# Patient Record
Sex: Male | Born: 1940 | Race: White | Hispanic: No | Marital: Married | Smoking: Former smoker
Health system: Southern US, Community
[De-identification: ages and names within clinical notes are randomized; demographics above are authoritative.]

## PROBLEM LIST (undated history)

## (undated) DIAGNOSIS — Q858 Other phakomatoses, not elsewhere classified: Secondary | ICD-10-CM

## (undated) DIAGNOSIS — I739 Peripheral vascular disease, unspecified: Secondary | ICD-10-CM

## (undated) DIAGNOSIS — M4800 Spinal stenosis, site unspecified: Secondary | ICD-10-CM

## (undated) DIAGNOSIS — M109 Gout, unspecified: Secondary | ICD-10-CM

## (undated) DIAGNOSIS — T753XXA Motion sickness, initial encounter: Secondary | ICD-10-CM

## (undated) DIAGNOSIS — E785 Hyperlipidemia, unspecified: Secondary | ICD-10-CM

## (undated) DIAGNOSIS — L570 Actinic keratosis: Secondary | ICD-10-CM

## (undated) DIAGNOSIS — Q8589 Other phakomatoses, not elsewhere classified: Secondary | ICD-10-CM

## (undated) DIAGNOSIS — B029 Zoster without complications: Secondary | ICD-10-CM

## (undated) DIAGNOSIS — M1990 Unspecified osteoarthritis, unspecified site: Secondary | ICD-10-CM

## (undated) DIAGNOSIS — F419 Anxiety disorder, unspecified: Secondary | ICD-10-CM

## (undated) DIAGNOSIS — R7303 Prediabetes: Secondary | ICD-10-CM

## (undated) DIAGNOSIS — E119 Type 2 diabetes mellitus without complications: Secondary | ICD-10-CM

## (undated) DIAGNOSIS — H353221 Exudative age-related macular degeneration, left eye, with active choroidal neovascularization: Secondary | ICD-10-CM

## (undated) DIAGNOSIS — Z974 Presence of external hearing-aid: Secondary | ICD-10-CM

## (undated) DIAGNOSIS — F410 Panic disorder [episodic paroxysmal anxiety] without agoraphobia: Secondary | ICD-10-CM

## (undated) HISTORY — PX: EYE SURGERY: SHX253

## (undated) HISTORY — PX: OTHER SURGICAL HISTORY: SHX169

## (undated) HISTORY — DX: Actinic keratosis: L57.0

## (undated) HISTORY — DX: Prediabetes: R73.03

## (undated) HISTORY — PX: TONSILLECTOMY: SUR1361

## (undated) HISTORY — PX: BILATERAL HIP ARTHROSCOPY: SUR89

## (undated) HISTORY — PX: JOINT REPLACEMENT: SHX530

---

## 2004-05-17 ENCOUNTER — Emergency Department: Payer: Self-pay | Admitting: Emergency Medicine

## 2004-11-20 ENCOUNTER — Ambulatory Visit: Payer: Self-pay | Admitting: Internal Medicine

## 2006-08-04 ENCOUNTER — Ambulatory Visit: Payer: Self-pay | Admitting: Unknown Physician Specialty

## 2006-11-13 ENCOUNTER — Ambulatory Visit: Payer: Self-pay | Admitting: Unknown Physician Specialty

## 2008-10-15 ENCOUNTER — Ambulatory Visit: Payer: Self-pay | Admitting: Family Medicine

## 2008-10-16 ENCOUNTER — Ambulatory Visit: Payer: Self-pay | Admitting: Internal Medicine

## 2008-10-23 ENCOUNTER — Ambulatory Visit: Payer: Self-pay | Admitting: Family Medicine

## 2009-11-23 ENCOUNTER — Ambulatory Visit: Payer: Self-pay | Admitting: Pain Medicine

## 2009-12-07 ENCOUNTER — Ambulatory Visit: Payer: Self-pay | Admitting: Pain Medicine

## 2009-12-19 ENCOUNTER — Ambulatory Visit: Payer: Self-pay | Admitting: Pain Medicine

## 2010-01-10 ENCOUNTER — Ambulatory Visit: Payer: Self-pay | Admitting: Pain Medicine

## 2010-03-01 ENCOUNTER — Ambulatory Visit: Payer: Self-pay | Admitting: Unknown Physician Specialty

## 2011-07-08 ENCOUNTER — Ambulatory Visit: Payer: Self-pay

## 2011-08-19 ENCOUNTER — Ambulatory Visit: Payer: Self-pay | Admitting: Internal Medicine

## 2015-02-27 ENCOUNTER — Ambulatory Visit: Admit: 2015-02-27 | Payer: Self-pay | Admitting: Gastroenterology

## 2015-02-27 SURGERY — COLONOSCOPY
Anesthesia: General

## 2015-03-03 DIAGNOSIS — M1A00X0 Idiopathic chronic gout, unspecified site, without tophus (tophi): Secondary | ICD-10-CM | POA: Insufficient documentation

## 2015-04-06 ENCOUNTER — Encounter: Payer: Self-pay | Admitting: *Deleted

## 2015-04-07 ENCOUNTER — Encounter: Payer: Self-pay | Admitting: *Deleted

## 2015-04-07 ENCOUNTER — Encounter
Admission: RE | Disposition: A | Discharge status: Home / Self Care | Payer: Self-pay | Source: Ambulatory Visit | Attending: Unknown Physician Specialty

## 2015-04-07 ENCOUNTER — Ambulatory Visit: Payer: Medicare Other | Admitting: Anesthesiology

## 2015-04-07 ENCOUNTER — Ambulatory Visit
Admission: RE | Admit: 2015-04-07 | Discharge: 2015-04-07 | Disposition: A | Discharge status: Home / Self Care | Payer: Medicare Other | Source: Ambulatory Visit | Attending: Unknown Physician Specialty | Admitting: Unknown Physician Specialty

## 2015-04-07 DIAGNOSIS — K640 First degree hemorrhoids: Secondary | ICD-10-CM | POA: Diagnosis not present

## 2015-04-07 DIAGNOSIS — M4800 Spinal stenosis, site unspecified: Secondary | ICD-10-CM | POA: Diagnosis not present

## 2015-04-07 DIAGNOSIS — Z801 Family history of malignant neoplasm of trachea, bronchus and lung: Secondary | ICD-10-CM | POA: Insufficient documentation

## 2015-04-07 DIAGNOSIS — M1990 Unspecified osteoarthritis, unspecified site: Secondary | ICD-10-CM | POA: Diagnosis not present

## 2015-04-07 DIAGNOSIS — E785 Hyperlipidemia, unspecified: Secondary | ICD-10-CM | POA: Diagnosis not present

## 2015-04-07 DIAGNOSIS — F419 Anxiety disorder, unspecified: Secondary | ICD-10-CM | POA: Diagnosis not present

## 2015-04-07 DIAGNOSIS — Z86010 Personal history of colonic polyps: Secondary | ICD-10-CM | POA: Diagnosis present

## 2015-04-07 DIAGNOSIS — Z8041 Family history of malignant neoplasm of ovary: Secondary | ICD-10-CM | POA: Diagnosis not present

## 2015-04-07 DIAGNOSIS — Z87891 Personal history of nicotine dependence: Secondary | ICD-10-CM | POA: Diagnosis not present

## 2015-04-07 DIAGNOSIS — E119 Type 2 diabetes mellitus without complications: Secondary | ICD-10-CM | POA: Insufficient documentation

## 2015-04-07 DIAGNOSIS — Z79899 Other long term (current) drug therapy: Secondary | ICD-10-CM | POA: Diagnosis not present

## 2015-04-07 DIAGNOSIS — Z800 Family history of malignant neoplasm of digestive organs: Secondary | ICD-10-CM | POA: Insufficient documentation

## 2015-04-07 DIAGNOSIS — M109 Gout, unspecified: Secondary | ICD-10-CM | POA: Insufficient documentation

## 2015-04-07 DIAGNOSIS — Q858 Other phakomatoses, not elsewhere classified: Secondary | ICD-10-CM | POA: Insufficient documentation

## 2015-04-07 DIAGNOSIS — Z9660 Presence of unspecified orthopedic joint implant: Secondary | ICD-10-CM | POA: Diagnosis not present

## 2015-04-07 HISTORY — DX: Hyperlipidemia, unspecified: E78.5

## 2015-04-07 HISTORY — PX: ESOPHAGOGASTRODUODENOSCOPY (EGD) WITH PROPOFOL: SHX5813

## 2015-04-07 HISTORY — DX: Other phakomatoses, not elsewhere classified: Q85.89

## 2015-04-07 HISTORY — DX: Gout, unspecified: M10.9

## 2015-04-07 HISTORY — DX: Zoster without complications: B02.9

## 2015-04-07 HISTORY — DX: Other phakomatoses, not elsewhere classified: Q85.8

## 2015-04-07 HISTORY — DX: Spinal stenosis, site unspecified: M48.00

## 2015-04-07 HISTORY — DX: Unspecified osteoarthritis, unspecified site: M19.90

## 2015-04-07 HISTORY — DX: Anxiety disorder, unspecified: F41.9

## 2015-04-07 HISTORY — PX: COLONOSCOPY WITH PROPOFOL: SHX5780

## 2015-04-07 HISTORY — DX: Type 2 diabetes mellitus without complications: E11.9

## 2015-04-07 SURGERY — COLONOSCOPY WITH PROPOFOL
Anesthesia: General

## 2015-04-07 MED ORDER — SODIUM CHLORIDE 0.9 % IV SOLN
INTRAVENOUS | Status: DC
  Administered 2015-04-07: 09:00:00 via INTRAVENOUS

## 2015-04-07 MED ORDER — SODIUM CHLORIDE 0.9 % IV SOLN
INTRAVENOUS | Status: DC
  Administered 2015-04-07: 1000 mL via INTRAVENOUS

## 2015-04-07 MED ORDER — LIDOCAINE HCL (PF) 2 % IJ SOLN
INTRAMUSCULAR | Status: DC | PRN
  Administered 2015-04-07: 50 mg

## 2015-04-07 MED ORDER — FENTANYL CITRATE (PF) 100 MCG/2ML IJ SOLN
INTRAMUSCULAR | Status: DC | PRN
  Administered 2015-04-07: 50 ug via INTRAVENOUS

## 2015-04-07 MED ORDER — PHENYLEPHRINE HCL 10 MG/ML IJ SOLN
INTRAMUSCULAR | Status: DC | PRN
  Administered 2015-04-07: 100 ug via INTRAVENOUS

## 2015-04-07 MED ORDER — PROPOFOL 500 MG/50ML IV EMUL
INTRAVENOUS | Status: DC | PRN
  Administered 2015-04-07: 100 ug/kg/min via INTRAVENOUS

## 2015-04-07 MED ORDER — PROPOFOL 10 MG/ML IV BOLUS
INTRAVENOUS | Status: DC | PRN
  Administered 2015-04-07: 30 mg via INTRAVENOUS

## 2015-04-07 MED ORDER — PIPERACILLIN-TAZOBACTAM 3.375 G IVPB 30 MIN
3.3750 g | Freq: Once | INTRAVENOUS | Status: AC
  Administered 2015-04-07: 3.375 g via INTRAVENOUS
  Filled 2015-04-07: qty 50

## 2015-04-07 NOTE — Transfer of Care (Signed)
Immediate Anesthesia Transfer of Care Note  Patient: Joe Tyler  Procedure(s) Performed: Procedure(s): COLONOSCOPY WITH PROPOFOL (N/A) ESOPHAGOGASTRODUODENOSCOPY (EGD) WITH PROPOFOL (N/A)  Patient Location: PACU  Anesthesia Type:General  Level of Consciousness: sedated  Airway & Oxygen Therapy: Patient Spontanous Breathing and Patient connected to nasal cannula oxygen  Post-op Assessment: Report given to RN and Post -op Vital signs reviewed and stable  Post vital signs: Reviewed and stable  Last Vitals:  Filed Vitals:   04/07/15 0854  BP: 125/73  Pulse: 88  Temp: 36.7 C  Resp: 20    Complications: No apparent anesthesia complications

## 2015-04-07 NOTE — Op Note (Signed)
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 Ola Spurr, MD (Referring MD) Complications:     No immediate complications. Procedure:         Pre-Anesthesia Assessment:                    - After reviewing the risks and benefits, the patient was                     deemed in satisfactory condition to undergo the procedure.                    After obtaining informed consent, the colonoscope was                     passed under direct vision. Throughout the procedure, the                     patient's blood pressure, pulse, and oxygen saturations                     were monitored continuously. The Colonoscope was                     introduced through the anus and advanced to the the cecum,                     identified by appendiceal orifice and ileocecal valve. The                     colonoscopy was performed without difficulty. The patient                     tolerated the procedure well. The quality of the bowel                     preparation was good. Findings:      Internal hemorrhoids were found during endoscopy. The hemorrhoids were       small and Grade I (internal hemorrhoids that do not prolapse).      The exam was otherwise without abnormality. Impression:        - Internal hemorrhoids.                    - The examination was otherwise normal.                    - No specimens collected. Recommendation:    - Repeat colonoscopy in 5 years for surveillance. If in                     good health. Manya Silvas, MD 04/07/2015 9:51:31 AM This report has been signed electronically. Number of Addenda: 0 Note Initiated On: 04/07/2015 9:17 AM Scope Withdrawal Time: 0  hours 10 minutes 32 seconds  Total Procedure Duration: 0 hours 13 minutes 31 seconds       [facility]

## 2015-04-07 NOTE — H&P (Signed)
   Primary Care Physician:  Adrian Prows, MD Primary Gastroenterologist:  Dr. Vira Agar  Pre-Procedure History & Physical: HPI:  Joe Tyler is a 74 y.o. male is here for an endoscopy and colonoscopy.   Past Medical History  Diagnosis Date  . Diabetes mellitus without complication ([place])   . Hyperlipidemia   . Gout   . Anxiety     Panic Attack  . 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 Arthritis   . Shingles   . Spinal stenosis     Past Surgical History  Procedure Laterality Date  . Tonsillectomy    . Bilateral hip arthroscopy    . Joint replacement    . Benign tumor removed Right arm    Prior to Admission medications   Medication Sig Start Date End Date Taking? Authorizing Provider  allopurinol (ZYLOPRIM) 300 MG tablet Take 300 mg by mouth daily.   Yes Historical Provider, MD  ALPRAZolam (XANAX) 0.25 MG tablet Take 0.25 mg by mouth 3 (three) times daily as needed for anxiety.   Yes Historical Provider, MD  Multiple Vitamin (MULTIVITAMIN) tablet Take 1 tablet by mouth daily.   Yes Historical Provider, MD    Allergies as of 02/21/2015  . (Not on File)    Family History  Problem Relation Age of Onset  . Pancreatic cancer Mother   . Lung cancer Paternal Uncle   . Ovarian cancer Maternal Grandmother     Social History   Social History  . Marital Status: Married    Spouse Name: N/A  . Number of Children: N/A  . Years of Education: N/A   Occupational History  . Not on file.   Social History Main Topics  . Smoking status: Former Research scientist (life sciences)  . Smokeless tobacco: Never Used  . Alcohol Use: Yes  . Drug Use: No  . Sexual Activity: Not on file   Other Topics Concern  . Not on file   Social History Narrative    Review of Systems: See HPI, otherwise negative ROS  Physical Exam: BP 125/73 mmHg  Pulse 88  Temp(Src) 98 F (36.7 C) (Oral)  Resp 20  Ht 5\' 11"  (1.803 m)  Wt 92.08 kg (203 lb)  BMI 28.33 kg/m2  SpO2 96% General:   Alert,  pleasant and  cooperative in NAD Head:  Normocephalic and atraumatic. Neck:  Supple; no masses or thyromegaly. Lungs:  Clear throughout to auscultation.    Heart:  Regular rate and rhythm. Abdomen:  Soft, nontender and nondistended. Normal bowel sounds, without guarding, and without rebound.   Neurologic:  Alert and  oriented x4;  grossly normal neurologically.  Impression/Plan: [name] is here for an endoscopy and colonoscopy to be performed for [facility] colon polyps and possible Peutz Jeghers syndrom  Risks, benefits, limitations, and alternatives regarding  endoscopy and colonoscopy have been reviewed with the patient.  Questions have been answered.  All parties agreeable.   Gaylyn Cheers, MD  04/07/2015, 9:12 AM

## 2015-04-07 NOTE — Op Note (Signed)
[facility] Patient Name: Joe Tyler Procedure Date: 04/07/2015 9:18 AM MRN: [id number] Account #: [id number] Date of Birth: Mar 21, 1941 Admit Type: Outpatient Age: 74 Room: [facility] ENDO ROOM 1 Gender: Male Note Status: Finalized Procedure:         Upper GI endoscopy Indications:       Possible Peutz Jegers syndrome Providers:         Manya Silvas, MD Referring MD:      Youlanda Roys. Ola Spurr, MD (Referring MD) Medicines:         Propofol per Anesthesia Complications:     No immediate complications. Procedure:         Pre-Anesthesia Assessment:                    - After reviewing the risks and benefits, the patient was                     deemed in satisfactory condition to undergo the procedure.                    After obtaining informed consent, the endoscope was passed                     under direct vision. Throughout the procedure, the                     patient's blood pressure, pulse, and oxygen saturations                     were monitored continuously. The Endoscope was introduced                     through the mouth, and advanced to the second part of                     duodenum. The upper GI endoscopy was accomplished without                     difficulty. The patient tolerated the procedure well. Findings:      The examined esophagus was normal. GEJ 42cm.      The stomach was normal.      The examined duodenum was normal. There was some flatttening of folds Impression:        - Normal esophagus.                    - Normal stomach.                    - Normal examined duodenum.                    - No specimens collected. Recommendation:    - Perform a colonoscopy today. Check celiac panel and H.                     pylori blood test. Manya Silvas, MD 04/07/2015 9:32:11 AM This report has been signed electronically. Number of Addenda: 0 Note Initiated On: 04/07/2015 9:18 AM      [facility]

## 2015-04-07 NOTE — Anesthesia Preprocedure Evaluation (Signed)
Anesthesia Evaluation  Patient identified by MRN, date of birth, ID band Patient awake    Reviewed: Allergy & Precautions, H&P , NPO status , Patient's Chart, lab work & pertinent test results, reviewed documented beta blocker date and time   History of Anesthesia Complications (+) AWARENESS UNDER ANESTHESIA and history of anesthetic complications  Airway Mallampati: II  TM Distance: >3 FB Neck ROM: full    Dental no notable dental hx. 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:   Pulmonary neg shortness of breath, neg sleep apnea, neg COPD, neg recent URI, former smoker,    Pulmonary exam normal breath sounds clear to auscultation       Cardiovascular Exercise Tolerance: Good negative cardio ROS Normal cardiovascular exam Rhythm:regular Rate:Normal     Neuro/Psych negative neurological ROS  negative psych ROS   GI/Hepatic Neg liver ROS, GERD  ,  Endo/Other  diabetes (borderline, controlled with diet)  Renal/GU negative Renal ROS  negative genitourinary   Musculoskeletal   Abdominal   Peds  Hematology negative hematology ROS (+)   Anesthesia Other Findings Past Medical History:   Diabetes mellitus without complication (HCC)                 Hyperlipidemia                                               Gout                                                         Anxiety                                                        Comment:Panic Attack   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[place])                                 Arthritis                                                    Shingles                                                     Spinal stenosis                                              Reproductive/Obstetrics negative OB ROS                             Anesthesia Physical Anesthesia  Plan  ASA: II  Anesthesia Plan: General   Post-op Pain Management:    Induction:   Airway  Management Planned:   Additional Equipment:   Intra-op Plan:   Post-operative Plan:   Informed Consent: I have reviewed the patients History and Physical, chart, labs and discussed the procedure including the risks, benefits and alternatives for the proposed anesthesia with the patient or authorized representative who has indicated his/her understanding and acceptance.   Dental Advisory Given  Plan Discussed with: Anesthesiologist, CRNA and Surgeon  Anesthesia Plan Comments:         Anesthesia Quick Evaluation

## 2015-04-10 NOTE — Anesthesia Postprocedure Evaluation (Addendum)
  Anesthesia Post-op Note  Patient: Joe Tyler  Procedure(s) Performed: Procedure(s): COLONOSCOPY WITH PROPOFOL (N/A) ESOPHAGOGASTRODUODENOSCOPY (EGD) WITH PROPOFOL (N/A)  Anesthesia type:General  Patient location: PACU  Post pain: Pain level controlled  Post assessment: Post-op Vital signs reviewed, Patient's Cardiovascular Status Stable, Respiratory Function Stable, Patent Airway and No signs of Nausea or vomiting  Post vital signs: Reviewed and stable  Last Vitals:  Filed Vitals:   04/07/15 1020  BP: 140/71  Pulse: 78  Temp:   Resp: 23    Level of consciousness: awake, alert  and patient cooperative  Complications: No apparent anesthesia complications

## 2015-04-13 ENCOUNTER — Encounter: Payer: Self-pay | Admitting: Unknown Physician Specialty

## 2015-05-04 ENCOUNTER — Encounter: Payer: Self-pay | Admitting: Oncology

## 2015-05-04 ENCOUNTER — Inpatient Hospital Stay: Payer: Medicare Other | Attending: Oncology | Admitting: Oncology

## 2015-05-04 DIAGNOSIS — Q858 Other phakomatoses, not elsewhere classified: Secondary | ICD-10-CM | POA: Insufficient documentation

## 2015-05-04 DIAGNOSIS — M1990 Unspecified osteoarthritis, unspecified site: Secondary | ICD-10-CM | POA: Insufficient documentation

## 2015-05-04 DIAGNOSIS — M4800 Spinal stenosis, site unspecified: Secondary | ICD-10-CM | POA: Insufficient documentation

## 2015-05-04 DIAGNOSIS — Q8589 Other phakomatoses, not elsewhere classified: Secondary | ICD-10-CM | POA: Insufficient documentation

## 2015-05-04 DIAGNOSIS — E785 Hyperlipidemia, unspecified: Secondary | ICD-10-CM | POA: Insufficient documentation

## 2015-05-16 ENCOUNTER — Telehealth: Payer: Self-pay

## 2015-05-16 NOTE — Telephone Encounter (Signed)
Received a fax from [facility] that genetic testing has been cancelled due to lack of insurance coverage and they have not received permission from patient to proceed with testing at their expense.

## 2015-05-20 NOTE — Progress Notes (Signed)
Joe Tyler  [phone]) [phone] [phone]) [phone]  ID: [name] OB: 18-Feb-1941  MR#: [id number]  [id number]  Patient Care Team: Adrian Prows, MD as PCP - General (Infectious Diseases)  CHIEF COMPLAINT:  Chief Complaint  Patient presents with  . New Patient (Initial Visit)    Genetic counseling    INTERVAL HISTORY: Patient is a 74 year old male who had a personal history of an isolated polyp consistent with Peutz-Jeghers syndrome.  He is referred to clinic for genetic counseling and possible testing. She currently feels well and is asymptomatic. He has no neurologic complaints. He denies any fevers. He has a good appetite and denies weight loss. He has no chest pain or shortness of breath. He denies any nausea, vomiting, constipation, or diarrhea. He has no melena or hematochezia. Patient feels at his baseline and offers no specific complaints today.  REVIEW OF SYSTEMS:   Review of Systems  Constitutional: Negative.   Respiratory: Negative.   Cardiovascular: Negative.   Gastrointestinal: Negative.  Negative for blood in stool and melena.  Musculoskeletal: Negative.   Neurological: Negative.     As per HPI. Otherwise, a complete review of systems is negatve.  PAST MEDICAL HISTORY: Past Medical History  Diagnosis Date  . Diabetes mellitus without complication ([place])   . Hyperlipidemia   . Gout   . Anxiety     Panic Attack  . 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 Arthritis   . Shingles   . Spinal stenosis     PAST SURGICAL HISTORY: Past Surgical History  Procedure Laterality Date  . Tonsillectomy    . Bilateral hip arthroscopy    . Joint replacement    . Benign tumor removed Right arm  . Colonoscopy with propofol N/A 04/07/2015    Procedure: COLONOSCOPY WITH PROPOFOL;  Surgeon: Manya Silvas, MD;  Location: [facility] ENDOSCOPY;  Service: Endoscopy;  Laterality: N/A;  . Esophagogastroduodenoscopy (egd) with propofol N/A 04/07/2015   Procedure: ESOPHAGOGASTRODUODENOSCOPY (EGD) WITH PROPOFOL;  Surgeon: Manya Silvas, MD;  Location: [facility] ENDOSCOPY;  Service: Endoscopy;  Laterality: N/A;    FAMILY HISTORY Family History  Problem Relation Age of Onset  . Pancreatic cancer Mother   . Lung cancer Paternal Uncle   . Ovarian cancer Maternal Grandmother        ADVANCED DIRECTIVES:    HEALTH MAINTENANCE: Social History  Substance Use Topics  . Smoking status: Former Research scientist (life sciences)  . Smokeless tobacco: Never Used  . Alcohol Use: Yes     Colonoscopy:  PAP:  Bone density:  Lipid panel:  Allergies  Allergen Reactions  . Levaquin [Levofloxacin] Other (See Comments) and Anxiety    Possible panic disorder Panic Disorder    Current Outpatient Prescriptions  Medication Sig Dispense Refill  . allopurinol (ZYLOPRIM) 300 MG tablet Take 300 mg by mouth daily.    Marland Kitchen ALPRAZolam (XANAX) 0.25 MG tablet Take 0.25 mg by mouth 3 (three) times daily as needed for anxiety.    . Multiple Vitamin (MULTIVITAMIN) tablet Take 1 tablet by mouth daily.     No current facility-administered medications for this visit.    OBJECTIVE: Filed Vitals:   05/04/15 1510  BP: 130/78  Pulse: 90  Temp: 97.4 F (36.3 C)     Body mass index is 28.09 kg/(m^2).    ECOG FS:0 - Asymptomatic  General: Well-developed, well-nourished, no acute distress. Eyes: Pink conjunctiva, anicteric sclera. Lungs: Clear to auscultation bilaterally. Heart: Regular rate and rhythm. No rubs, murmurs, or gallops. Abdomen: Soft, nontender, nondistended.  No organomegaly noted, normoactive bowel sounds. Musculoskeletal: No edema, cyanosis, or clubbing. Neuro: Alert, answering all questions appropriately. Cranial nerves grossly intact. Skin: No rashes or petechiae noted. Psych: Normal affect.   LAB RESULTS:  No results found for: NA, K, CL, CO2, GLUCOSE, BUN, CREATININE, CALCIUM, PROT, ALBUMIN, AST, ALT, ALKPHOS, BILITOT, GFRNONAA, GFRAA  No results found for: WBC,  NEUTROABS, HGB, HCT, MCV, PLT   STUDIES: No results found.  ASSESSMENT: Personal history of polyp consistent with her Peutz-Jeghers syndrome.  PLAN:    1. Peutz-Jeghers syndrome: Although patient has a personal history of a polyp removed consistent with Peutz-Jeghers syndrome, his most recent colonoscopy and EGD on April 07, 2015 did not reveal any significant pathology. Criteria for testing includes 2 or more histologically proven follow-ups.   Patient does not have a family history Peutz-Jeghers syndrome and only has one histologically confirmed Peutz-Jeghers polyp several years ago. After lengthy discussion with the patient wished to testing for the STK-11 gene mutation to confirm the diagnosis. If patient were positive, have recommended testing and his children since this is a autosomal dominant trait. He would also require routine screening with upper and lower endoscopies every 1-3 years. If positive polyps are detected, he should monitored annually. Patient reports he has capsule endoscopy in the near future. Ultimately, genetic testing was denied by insurance. Patient has been instructed to keep his regular follow scheduled appointments with gastroenterology. No follow-up has been scheduled.  Approximately 45 minutes was spent in discussion of which greater than 50% was consultation.     Patient expressed understanding and was in agreement with this plan. He also understands that He can call clinic at any time with any questions, concerns, or complaints.    Lloyd Huger, MD   05/20/2015 3:33 PM

## 2015-06-08 DIAGNOSIS — Z96649 Presence of unspecified artificial hip joint: Secondary | ICD-10-CM | POA: Insufficient documentation

## 2015-10-24 ENCOUNTER — Other Ambulatory Visit: Payer: Self-pay | Admitting: Physical Medicine and Rehabilitation

## 2015-10-24 DIAGNOSIS — M48061 Spinal stenosis, lumbar region without neurogenic claudication: Secondary | ICD-10-CM

## 2015-11-15 ENCOUNTER — Ambulatory Visit: Payer: Medicare Other

## 2015-11-21 ENCOUNTER — Ambulatory Visit
Admission: RE | Admit: 2015-11-21 | Discharge: 2015-11-21 | Disposition: A | Discharge status: Home / Self Care | Payer: Medicare Other | Source: Ambulatory Visit | Attending: Physical Medicine and Rehabilitation | Admitting: Physical Medicine and Rehabilitation

## 2015-11-21 DIAGNOSIS — M48061 Spinal stenosis, lumbar region without neurogenic claudication: Secondary | ICD-10-CM

## 2015-11-21 DIAGNOSIS — M5416 Radiculopathy, lumbar region: Secondary | ICD-10-CM | POA: Diagnosis present

## 2015-11-21 DIAGNOSIS — M5136 Other intervertebral disc degeneration, lumbar region: Secondary | ICD-10-CM | POA: Insufficient documentation

## 2016-03-04 DIAGNOSIS — M25552 Pain in left hip: Secondary | ICD-10-CM | POA: Insufficient documentation

## 2016-03-04 DIAGNOSIS — G8929 Other chronic pain: Secondary | ICD-10-CM | POA: Insufficient documentation

## 2016-06-06 DIAGNOSIS — R9431 Abnormal electrocardiogram [ECG] [EKG]: Secondary | ICD-10-CM | POA: Insufficient documentation

## 2016-06-19 ENCOUNTER — Encounter
Admission: RE | Admit: 2016-06-19 | Discharge: 2016-06-19 | Disposition: A | Discharge status: Home / Self Care | Payer: Medicare Other | Source: Ambulatory Visit | Attending: Neurosurgery | Admitting: Neurosurgery

## 2016-06-19 ENCOUNTER — Ambulatory Visit
Admission: RE | Admit: 2016-06-19 | Discharge: 2016-06-19 | Disposition: A | Discharge status: Home / Self Care | Payer: Medicare Other | Source: Ambulatory Visit | Attending: Neurosurgery | Admitting: Neurosurgery

## 2016-06-19 DIAGNOSIS — Z01818 Encounter for other preprocedural examination: Secondary | ICD-10-CM | POA: Insufficient documentation

## 2016-06-19 DIAGNOSIS — K6389 Other specified diseases of intestine: Secondary | ICD-10-CM | POA: Insufficient documentation

## 2016-06-19 DIAGNOSIS — J9811 Atelectasis: Secondary | ICD-10-CM | POA: Insufficient documentation

## 2016-06-19 DIAGNOSIS — R918 Other nonspecific abnormal finding of lung field: Secondary | ICD-10-CM | POA: Insufficient documentation

## 2016-06-19 DIAGNOSIS — Z79899 Other long term (current) drug therapy: Secondary | ICD-10-CM | POA: Diagnosis not present

## 2016-06-19 DIAGNOSIS — Z01810 Encounter for preprocedural cardiovascular examination: Secondary | ICD-10-CM | POA: Diagnosis not present

## 2016-06-19 LAB — SURGICAL PCR SCREEN
MRSA, PCR: NEGATIVE
Staphylococcus aureus: NEGATIVE

## 2016-06-19 LAB — URINALYSIS, COMPLETE (UACMP) WITH MICROSCOPIC
BACTERIA UA: NONE SEEN
BILIRUBIN URINE: NEGATIVE
GLUCOSE, UA: NEGATIVE mg/dL
HGB URINE DIPSTICK: NEGATIVE
KETONES UR: NEGATIVE mg/dL
Leukocytes, UA: NEGATIVE
NITRITE: NEGATIVE
PROTEIN: NEGATIVE mg/dL
Specific Gravity, Urine: 1.017 (ref 1.005–1.030)
Squamous Epithelial / LPF: NONE SEEN
pH: 6 (ref 5.0–8.0)

## 2016-06-19 LAB — CBC
HCT: 47.1 % (ref 40.0–52.0)
HEMOGLOBIN: 16.1 g/dL (ref 13.0–18.0)
MCH: 31.3 pg (ref 26.0–34.0)
MCHC: 34.2 g/dL (ref 32.0–36.0)
MCV: 91.6 fL (ref 80.0–100.0)
PLATELETS: 179 10*3/uL (ref 150–440)
RBC: 5.14 MIL/uL (ref 4.40–5.90)
RDW: 13.7 % (ref 11.5–14.5)
WBC: 6.6 10*3/uL (ref 3.8–10.6)

## 2016-06-19 LAB — BASIC METABOLIC PANEL
Anion gap: 6 (ref 5–15)
BUN: 14 mg/dL (ref 6–20)
CHLORIDE: 105 mmol/L (ref 101–111)
CO2: 27 mmol/L (ref 22–32)
CREATININE: 0.84 mg/dL (ref 0.61–1.24)
Calcium: 9.8 mg/dL (ref 8.9–10.3)
GFR calc non Af Amer: >60 mL/min (ref >60)
Glucose, Bld: 91 mg/dL (ref 65–99)
POTASSIUM: 4 mmol/L (ref 3.5–5.1)
SODIUM: 138 mmol/L (ref 135–145)

## 2016-06-19 LAB — PROTIME-INR
INR: 0.98
PROTHROMBIN TIME: 13 s (ref 11.4–15.2)

## 2016-06-19 LAB — APTT: aPTT: 30 seconds (ref 24–36)

## 2016-06-19 NOTE — Patient Instructions (Signed)
Your procedure is scheduled on: Wednesday 06/27/15 Report to [facility]. 2ND FLOOR MEDICAL MALL ENTRANCE. To find out your arrival time please call [phone] between 1PM - 3PM on Tuesday 06/26/15.  Remember: Instructions that are not followed completely may result in serious medical risk, up to and including death, or upon the discretion of your surgeon and anesthesiologist your surgery may need to be rescheduled.    __X__ 1. Do not eat food or drink liquids after midnight. No gum chewing or hard candies.     __X__ 2. No Alcohol for 24 hours before or after surgery.   ____ 3. Bring all medications with you on the day of surgery if instructed.    __X__ 4. Notify your doctor if there is any change in your medical condition     (cold, fever, infections).             ___X__5. No smoking within 24 hours of your surgery.     Do not wear jewelry, make-up, hairpins, clips or nail polish.  Do not wear lotions, powders, or perfumes.   Do not shave 48 hours prior to surgery. Men may shave face and neck.  Do not bring valuables to the hospital.    [facility] is not responsible for any belongings or valuables.               Contacts, dentures or bridgework may not be worn into surgery.  Leave your suitcase in the car. After surgery it may be brought to your room.  For patients admitted to the hospital, discharge time is determined by your                treatment team.   Patients discharged the day of surgery will not be allowed to drive home.   Please read over the following fact sheets that you were given:   Pain Booklet and MRSA Information   __X__ Take these medicines the morning of surgery with A SIP OF WATER:    1. GABAPENTIN  2. ALPRAZOLAM IF NEEDED  3.   4.  5.  6.  ____ Fleet Enema (as directed)   __X__ Use CHG Soap as directed  ____ Use inhalers on the day of surgery  ____ Stop metformin 2 days prior to surgery    ____ Take 1/2 of usual insulin dose the night before  surgery and none on the morning of surgery.   ____ Stop Coumadin/Plavix/aspirin on   __X__ Stop Anti-inflammatories such as Advil, Aleve, Ibuprofen, Motrin, Naproxen, Naprosyn, Goodies,powder, or aspirin products.  OK to take Tylenol.   ____ Stop supplements until after surgery.    ____ Bring C-Pap to the hospital.

## 2016-06-20 NOTE — Pre-Procedure Instructions (Signed)
LM FOR KENDELYN AT DR YARBOROUGH'S RE NEED TO ADDRESS CXR PREOP

## 2016-06-20 NOTE — Pre-Procedure Instructions (Signed)
CXR sent to Dr. Cari Caraway and Anesthesia for review.

## 2016-06-21 NOTE — Pre-Procedure Instructions (Addendum)
Cleared low risk by dr Saralyn Pilar 06/20/16. cxr ok by dr Joretta Bachelor and dr Larey Days

## 2016-06-26 ENCOUNTER — Ambulatory Visit: Payer: Medicare Other

## 2016-06-26 ENCOUNTER — Encounter: Payer: Self-pay | Admitting: *Deleted

## 2016-06-26 ENCOUNTER — Encounter
Admission: RE | Disposition: A | Discharge status: Home / Self Care | Payer: Self-pay | Source: Ambulatory Visit | Attending: Neurosurgery

## 2016-06-26 ENCOUNTER — Ambulatory Visit: Payer: Medicare Other | Admitting: Anesthesiology

## 2016-06-26 ENCOUNTER — Observation Stay
Admission: RE | Admit: 2016-06-26 | Discharge: 2016-06-27 | Disposition: A | Discharge status: Home / Self Care | Payer: Medicare Other | Source: Ambulatory Visit | Attending: Neurosurgery | Admitting: Neurosurgery

## 2016-06-26 DIAGNOSIS — Z87891 Personal history of nicotine dependence: Secondary | ICD-10-CM | POA: Insufficient documentation

## 2016-06-26 DIAGNOSIS — R7303 Prediabetes: Secondary | ICD-10-CM | POA: Diagnosis not present

## 2016-06-26 DIAGNOSIS — F419 Anxiety disorder, unspecified: Secondary | ICD-10-CM | POA: Diagnosis not present

## 2016-06-26 DIAGNOSIS — Z79899 Other long term (current) drug therapy: Secondary | ICD-10-CM | POA: Diagnosis not present

## 2016-06-26 DIAGNOSIS — M48062 Spinal stenosis, lumbar region with neurogenic claudication: Principal | ICD-10-CM | POA: Insufficient documentation

## 2016-06-26 DIAGNOSIS — M4807 Spinal stenosis, lumbosacral region: Secondary | ICD-10-CM | POA: Diagnosis not present

## 2016-06-26 DIAGNOSIS — Z419 Encounter for procedure for purposes other than remedying health state, unspecified: Secondary | ICD-10-CM

## 2016-06-26 HISTORY — PX: LUMBAR LAMINECTOMY/DECOMPRESSION MICRODISCECTOMY: SHX5026

## 2016-06-26 LAB — GLUCOSE, CAPILLARY
GLUCOSE-CAPILLARY: 134 mg/dL — AB (ref 65–99)
Glucose-Capillary: 113 mg/dL — ABNORMAL HIGH (ref 65–99)

## 2016-06-26 SURGERY — LUMBAR LAMINECTOMY/DECOMPRESSION MICRODISCECTOMY 2 LEVELS
Anesthesia: General | Site: Spine Lumbar | Wound class: Clean

## 2016-06-26 MED ORDER — SODIUM CHLORIDE FLUSH 0.9 % IV SOLN
INTRAVENOUS | Status: AC
  Filled 2016-06-26: qty 20

## 2016-06-26 MED ORDER — FENTANYL CITRATE (PF) 100 MCG/2ML IJ SOLN: 25.0000 ug | INTRAMUSCULAR | Status: DC | PRN

## 2016-06-26 MED ORDER — PHENYLEPHRINE HCL 10 MG/ML IJ SOLN
INTRAMUSCULAR | Status: DC | PRN
  Administered 2016-06-26 (×2): 120 ug via INTRAVENOUS
  Administered 2016-06-26 (×2): 80 ug via INTRAVENOUS

## 2016-06-26 MED ORDER — METHOCARBAMOL 1000 MG/10ML IJ SOLN
500.0000 mg | Freq: Four times a day (QID) | INTRAVENOUS | Status: DC | PRN
  Filled 2016-06-26: qty 5

## 2016-06-26 MED ORDER — BUPIVACAINE LIPOSOME 1.3 % IJ SUSP
INTRAMUSCULAR | Status: DC | PRN
  Administered 2016-06-26: 20 mL

## 2016-06-26 MED ORDER — SUGAMMADEX SODIUM 200 MG/2ML IV SOLN
INTRAVENOUS | Status: AC
  Filled 2016-06-26: qty 2

## 2016-06-26 MED ORDER — ONDANSETRON HCL 4 MG/2ML IJ SOLN
4.0000 mg | INTRAMUSCULAR | Status: DC | PRN
Start: 2016-06-26 — End: 2016-06-27

## 2016-06-26 MED ORDER — GABAPENTIN 100 MG PO CAPS
200.0000 mg | ORAL_CAPSULE | Freq: Three times a day (TID) | ORAL | Status: DC
  Administered 2016-06-26 (×2): 200 mg via ORAL
  Filled 2016-06-26 (×2): qty 2
  Filled 2016-06-26: qty 1

## 2016-06-26 MED ORDER — SODIUM CHLORIDE 0.9 % IV SOLN
INTRAVENOUS | Status: DC
  Administered 2016-06-26: 10:00:00 via INTRAVENOUS

## 2016-06-26 MED ORDER — POTASSIUM CHLORIDE IN NACL 20-0.9 MEQ/L-% IV SOLN
INTRAVENOUS | Status: DC
  Administered 2016-06-26: 21:00:00 via INTRAVENOUS
  Filled 2016-06-26 (×4): qty 1000

## 2016-06-26 MED ORDER — SUCCINYLCHOLINE CHLORIDE 20 MG/ML IJ SOLN
INTRAMUSCULAR | Status: DC | PRN
  Administered 2016-06-26: 100 mg via INTRAVENOUS

## 2016-06-26 MED ORDER — ACETAMINOPHEN 10 MG/ML IV SOLN
INTRAVENOUS | Status: AC
  Filled 2016-06-26: qty 100

## 2016-06-26 MED ORDER — PHENYLEPHRINE 40 MCG/ML (10ML) SYRINGE FOR IV PUSH (FOR BLOOD PRESSURE SUPPORT)
PREFILLED_SYRINGE | INTRAVENOUS | Status: AC
  Filled 2016-06-26: qty 10

## 2016-06-26 MED ORDER — BACITRACIN 50000 UNITS IM SOLR
INTRAMUSCULAR | Status: AC
  Filled 2016-06-26: qty 1

## 2016-06-26 MED ORDER — ACETAMINOPHEN 325 MG PO TABS: 650.0000 mg | ORAL_TABLET | ORAL | Status: DC | PRN

## 2016-06-26 MED ORDER — THROMBIN 5000 UNITS EX SOLR
CUTANEOUS | Status: AC
  Filled 2016-06-26: qty 5000

## 2016-06-26 MED ORDER — BACITRACIN 50000 UNITS IM SOLR
INTRAMUSCULAR | Status: DC | PRN
  Administered 2016-06-26: 1

## 2016-06-26 MED ORDER — LIDOCAINE HCL (CARDIAC) 20 MG/ML IV SOLN
INTRAVENOUS | Status: DC | PRN
  Administered 2016-06-26: 30 mg via INTRAVENOUS

## 2016-06-26 MED ORDER — METHYLPREDNISOLONE ACETATE 40 MG/ML IJ SUSP
INTRAMUSCULAR | Status: AC
  Filled 2016-06-26: qty 1

## 2016-06-26 MED ORDER — SUCCINYLCHOLINE CHLORIDE 200 MG/10ML IV SOSY
PREFILLED_SYRINGE | INTRAVENOUS | Status: AC
  Filled 2016-06-26: qty 10

## 2016-06-26 MED ORDER — ONDANSETRON HCL 4 MG/2ML IJ SOLN
INTRAMUSCULAR | Status: DC | PRN
  Administered 2016-06-26: 4 mg via INTRAVENOUS

## 2016-06-26 MED ORDER — CEFAZOLIN SODIUM-DEXTROSE 2-4 GM/100ML-% IV SOLN
INTRAVENOUS | Status: AC
  Filled 2016-06-26: qty 100

## 2016-06-26 MED ORDER — ROCURONIUM BROMIDE 100 MG/10ML IV SOLN
INTRAVENOUS | Status: DC | PRN
  Administered 2016-06-26: 20 mg via INTRAVENOUS
  Administered 2016-06-26: 10 mg via INTRAVENOUS
  Administered 2016-06-26: 40 mg via INTRAVENOUS

## 2016-06-26 MED ORDER — GELATIN ABSORBABLE 12-7 MM EX MISC
CUTANEOUS | Status: DC | PRN
  Administered 2016-06-26: 1

## 2016-06-26 MED ORDER — ACETAMINOPHEN 650 MG RE SUPP: 650.0000 mg | RECTAL | Status: DC | PRN

## 2016-06-26 MED ORDER — FENTANYL CITRATE (PF) 250 MCG/5ML IJ SOLN
INTRAMUSCULAR | Status: AC
  Filled 2016-06-26: qty 5

## 2016-06-26 MED ORDER — HYDROCODONE-ACETAMINOPHEN 5-325 MG PO TABS: 1.0000 | ORAL_TABLET | Freq: Four times a day (QID) | ORAL | 0 refills | Status: DC | PRN

## 2016-06-26 MED ORDER — FENTANYL CITRATE (PF) 100 MCG/2ML IJ SOLN
INTRAMUSCULAR | Status: DC | PRN
  Administered 2016-06-26 (×2): 100 ug via INTRAVENOUS

## 2016-06-26 MED ORDER — SUGAMMADEX SODIUM 200 MG/2ML IV SOLN
INTRAVENOUS | Status: DC | PRN
  Administered 2016-06-26: 200 mg via INTRAVENOUS

## 2016-06-26 MED ORDER — ROCURONIUM BROMIDE 50 MG/5ML IV SOSY
PREFILLED_SYRINGE | INTRAVENOUS | Status: AC
Start: 2016-06-26 — End: 2016-06-26
  Filled 2016-06-26: qty 5

## 2016-06-26 MED ORDER — MENTHOL 3 MG MT LOZG
1.0000 | LOZENGE | OROMUCOSAL | Status: DC | PRN
Start: 2016-06-26 — End: 2016-06-27
  Filled 2016-06-26: qty 9

## 2016-06-26 MED ORDER — PHENOL 1.4 % MT LIQD
1.0000 | OROMUCOSAL | Status: DC | PRN
  Filled 2016-06-26: qty 177

## 2016-06-26 MED ORDER — PROPOFOL 10 MG/ML IV BOLUS
INTRAVENOUS | Status: AC
  Filled 2016-06-26: qty 20

## 2016-06-26 MED ORDER — ZOLPIDEM TARTRATE 5 MG PO TABS: 5.0000 mg | ORAL_TABLET | Freq: Every evening | ORAL | Status: DC | PRN

## 2016-06-26 MED ORDER — DEXAMETHASONE SODIUM PHOSPHATE 10 MG/ML IJ SOLN
INTRAMUSCULAR | Status: DC | PRN
  Administered 2016-06-26: 10 mg via INTRAVENOUS

## 2016-06-26 MED ORDER — ONDANSETRON HCL 4 MG/2ML IJ SOLN: 4.0000 mg | Freq: Once | INTRAMUSCULAR | Status: DC | PRN

## 2016-06-26 MED ORDER — METHOCARBAMOL 500 MG PO TABS: 500.0000 mg | ORAL_TABLET | Freq: Four times a day (QID) | ORAL | 0 refills | Status: DC

## 2016-06-26 MED ORDER — PROPOFOL 10 MG/ML IV BOLUS
INTRAVENOUS | Status: DC | PRN
  Administered 2016-06-26: 120 mg via INTRAVENOUS

## 2016-06-26 MED ORDER — SODIUM CHLORIDE 0.9 % IJ SOLN
INTRAMUSCULAR | Status: AC
  Filled 2016-06-26: qty 10

## 2016-06-26 MED ORDER — FAMOTIDINE 20 MG PO TABS
20.0000 mg | ORAL_TABLET | Freq: Once | ORAL | Status: AC
  Administered 2016-06-26: 20 mg via ORAL

## 2016-06-26 MED ORDER — CEFAZOLIN SODIUM-DEXTROSE 2-4 GM/100ML-% IV SOLN
2.0000 g | Freq: Once | INTRAVENOUS | Status: AC
  Administered 2016-06-26: 2 g via INTRAVENOUS

## 2016-06-26 MED ORDER — BUPIVACAINE-EPINEPHRINE (PF) 0.5% -1:200000 IJ SOLN
INTRAMUSCULAR | Status: DC | PRN
  Administered 2016-06-26: 10 mL

## 2016-06-26 MED ORDER — METHOCARBAMOL 500 MG PO TABS
500.0000 mg | ORAL_TABLET | Freq: Four times a day (QID) | ORAL | Status: DC | PRN
Start: 2016-06-26 — End: 2016-06-27

## 2016-06-26 MED ORDER — ONDANSETRON HCL 4 MG/2ML IJ SOLN
INTRAMUSCULAR | Status: AC
  Filled 2016-06-26: qty 2

## 2016-06-26 MED ORDER — DEXAMETHASONE SODIUM PHOSPHATE 10 MG/ML IJ SOLN
INTRAMUSCULAR | Status: AC
  Filled 2016-06-26: qty 1

## 2016-06-26 MED ORDER — DOCUSATE SODIUM 100 MG PO CAPS
100.0000 mg | ORAL_CAPSULE | Freq: Two times a day (BID) | ORAL | Status: DC
  Administered 2016-06-26: 100 mg via ORAL
  Filled 2016-06-26: qty 1

## 2016-06-26 MED ORDER — BUPIVACAINE HCL (PF) 0.5 % IJ SOLN
INTRAMUSCULAR | Status: AC
  Filled 2016-06-26: qty 30

## 2016-06-26 MED ORDER — HYDROCODONE-ACETAMINOPHEN 5-325 MG PO TABS: 1.0000 | ORAL_TABLET | ORAL | Status: DC | PRN

## 2016-06-26 MED ORDER — ALPRAZOLAM 0.25 MG PO TABS: 0.2500 mg | ORAL_TABLET | Freq: Three times a day (TID) | ORAL | Status: DC | PRN

## 2016-06-26 MED ORDER — ALLOPURINOL 300 MG PO TABS
300.0000 mg | ORAL_TABLET | Freq: Every day | ORAL | Status: DC
  Administered 2016-06-26: 300 mg via ORAL
  Filled 2016-06-26 (×2): qty 1

## 2016-06-26 MED ORDER — FAMOTIDINE 20 MG PO TABS
ORAL_TABLET | ORAL | Status: AC
  Filled 2016-06-26: qty 1

## 2016-06-26 MED ORDER — LIDOCAINE 2% (20 MG/ML) 5 ML SYRINGE
INTRAMUSCULAR | Status: AC
  Filled 2016-06-26: qty 5

## 2016-06-26 MED ORDER — ACETAMINOPHEN 10 MG/ML IV SOLN
INTRAVENOUS | Status: DC | PRN
  Administered 2016-06-26: 1000 mg via INTRAVENOUS

## 2016-06-26 MED ORDER — BUPIVACAINE LIPOSOME 1.3 % IJ SUSP
INTRAMUSCULAR | Status: AC
  Filled 2016-06-26: qty 20

## 2016-06-26 MED ORDER — MIDAZOLAM HCL 2 MG/2ML IJ SOLN
INTRAMUSCULAR | Status: DC | PRN
  Administered 2016-06-26: 2 mg via INTRAVENOUS

## 2016-06-26 MED ORDER — ENOXAPARIN SODIUM 40 MG/0.4ML ~~LOC~~ SOLN: 40.0000 mg | SUBCUTANEOUS | Status: DC

## 2016-06-26 MED ORDER — ROCURONIUM BROMIDE 50 MG/5ML IV SOSY
PREFILLED_SYRINGE | INTRAVENOUS | Status: AC
  Filled 2016-06-26: qty 5

## 2016-06-26 MED ORDER — METHYLPREDNISOLONE ACETATE 40 MG/ML IJ SUSP
INTRAMUSCULAR | Status: DC | PRN
  Administered 2016-06-26: 40 mg

## 2016-06-26 MED ORDER — EPINEPHRINE PF 1 MG/ML IJ SOLN
INTRAMUSCULAR | Status: AC
  Filled 2016-06-26: qty 1

## 2016-06-26 MED ORDER — MIDAZOLAM HCL 2 MG/2ML IJ SOLN
INTRAMUSCULAR | Status: AC
  Filled 2016-06-26: qty 2

## 2016-06-26 MED ORDER — GELATIN ABSORBABLE 12-7 MM EX MISC
CUTANEOUS | Status: AC
  Filled 2016-06-26: qty 1

## 2016-06-26 SURGICAL SUPPLY — 63 items
BAND RUBBER 3X1/6 TAN STRL (MISCELLANEOUS) ×6 IMPLANT
BLADE BOVIE TIP EXT 4 (BLADE) ×3 IMPLANT
BUR NEURO DRILL SOFT 3.0X3.8M (BURR) ×3 IMPLANT
CANISTER SUCT 1200ML W/VALVE (MISCELLANEOUS) ×3 IMPLANT
CHLORAPREP W/TINT 26ML (MISCELLANEOUS) ×6 IMPLANT
CNTNR SPEC 2.5X3XGRAD LEK (MISCELLANEOUS) ×1
CONT SPEC 4OZ STER OR WHT (MISCELLANEOUS) ×2
CONTAINER SPEC 2.5X3XGRAD LEK (MISCELLANEOUS) ×1 IMPLANT
COUNTER NEEDLE 20/40 LG (NEEDLE) ×3 IMPLANT
COVER LIGHT HANDLE STERIS (MISCELLANEOUS) ×6 IMPLANT
CUP MEDICINE 2OZ PLAST GRAD ST (MISCELLANEOUS) ×6 IMPLANT
DERMABOND ADVANCED (GAUZE/BANDAGES/DRESSINGS) ×2
DERMABOND ADVANCED .7 DNX12 (GAUZE/BANDAGES/DRESSINGS) ×1 IMPLANT
DRAPE C-ARM 42X72 X-RAY (DRAPES) ×6 IMPLANT
DRAPE C-ARM XRAY 36X54 (DRAPES) IMPLANT
DRAPE LAPAROTOMY 100X77 ABD (DRAPES) ×3 IMPLANT
DRAPE MICROSCOPE LEICA (MISCELLANEOUS) ×3 IMPLANT
DRAPE POUCH INSTRU U-SHP 10X18 (DRAPES) ×3 IMPLANT
DRAPE SURG 17X11 SM STRL (DRAPES) ×12 IMPLANT
DRESSING TELFA 4X3 1S ST N-ADH (GAUZE/BANDAGES/DRESSINGS) ×3 IMPLANT
DRSG TEGADERM 4X4.75 (GAUZE/BANDAGES/DRESSINGS) ×3 IMPLANT
DURASEAL APPLICATOR TIP (TIP) IMPLANT
DURASEAL SPINE SEALANT 3ML (MISCELLANEOUS) IMPLANT
ELECT CAUTERY BLADE TIP 2.5 (TIP) ×3
ELECT EZSTD 165MM 6.5IN (MISCELLANEOUS) ×3
ELECTRODE CAUTERY BLDE TIP 2.5 (TIP) ×1 IMPLANT
ELECTRODE EZSTD 165MM 6.5IN (MISCELLANEOUS) ×1 IMPLANT
FRAME EYE SHIELD (PROTECTIVE WEAR) ×3 IMPLANT
GLOVE SURG SYN 8.5  E (GLOVE) ×6
GLOVE SURG SYN 8.5 E (GLOVE) ×3 IMPLANT
GOWN STRL REUS W/ TWL LRG LVL3 (GOWN DISPOSABLE) ×1 IMPLANT
GOWN STRL REUS W/ TWL XL LVL3 (GOWN DISPOSABLE) ×1 IMPLANT
GOWN STRL REUS W/TWL LRG LVL3 (GOWN DISPOSABLE) ×2
GOWN STRL REUS W/TWL XL LVL3 (GOWN DISPOSABLE) ×2
GRADUATE 1200CC STRL 31836 (MISCELLANEOUS) ×3 IMPLANT
KIT SPINAL PRONEVIEW (KITS) ×3 IMPLANT
KNIFE BAYONET SHORT DISCETOMY (MISCELLANEOUS) IMPLANT
MARKER SKIN DUAL TIP RULER LAB (MISCELLANEOUS) ×6 IMPLANT
NDL SAFETY ECLIPSE 18X1.5 (NEEDLE) ×1 IMPLANT
NEEDLE HYPO 18GX1.5 SHARP (NEEDLE) ×2
NEEDLE HYPO 22GX1.5 SAFETY (NEEDLE) ×3 IMPLANT
NS IRRIG 1000ML POUR BTL (IV SOLUTION) ×3 IMPLANT
PACK LAMINECTOMY NEURO (CUSTOM PROCEDURE TRAY) ×3 IMPLANT
PAD ARMBOARD 7.5X6 YLW CONV (MISCELLANEOUS) ×3 IMPLANT
PATTIES SURGICAL .5X1.5 (GAUZE/BANDAGES/DRESSINGS) IMPLANT
SPOGE SURGIFLO 8M (HEMOSTASIS) ×2
SPONGE SURGIFLO 8M (HEMOSTASIS) ×1 IMPLANT
STAPLER SKIN PROX 35W (STAPLE) IMPLANT
SUT DVC VLOC 3-0 CL 6 P-12 (SUTURE) ×6 IMPLANT
SUT NURALON 4 0 TR CR/8 (SUTURE) IMPLANT
SUT VIC AB 0 CT1 27 (SUTURE) ×6
SUT VIC AB 0 CT1 27XCR 8 STRN (SUTURE) ×3 IMPLANT
SUT VIC AB 2-0 CT1 18 (SUTURE) ×9 IMPLANT
SUT VIC AB 3-0 SH 27 (SUTURE) ×2
SUT VIC AB 3-0 SH 27X BRD (SUTURE) ×1 IMPLANT
SUT VICRYL 0 UR6 27IN ABS (SUTURE) ×3 IMPLANT
SYR 20CC LL (SYRINGE) ×3 IMPLANT
SYRINGE 10CC LL (SYRINGE) ×3 IMPLANT
TOWEL OR 17X26 4PK STRL BLUE (TOWEL DISPOSABLE) ×6 IMPLANT
TUBE MATRX SPINL 22MM 6CM DISP (INSTRUMENTS) ×1
TUBE METRX SPINAL 22X6 DISP (INSTRUMENTS) ×1 IMPLANT
TUBING CONNECTING 10 (TUBING) ×2 IMPLANT
TUBING CONNECTING 10' (TUBING) ×1

## 2016-06-26 NOTE — Anesthesia Postprocedure Evaluation (Signed)
Anesthesia Post Note  Patient: Joe Tyler  Procedure(s) Performed: Procedure(s) (LRB): LUMBAR LAMINECTOMY/DECOMPRESSION MICRODISCECTOMY 2 LEVELS L5-S1 (N/A)  Patient location during evaluation: PACU Anesthesia Type: General Level of consciousness: awake and alert Pain management: pain level controlled Vital Signs Assessment: post-procedure vital signs reviewed and stable Respiratory status: spontaneous breathing and respiratory function stable Cardiovascular status: stable Anesthetic complications: no     Last Vitals:  Vitals:   06/26/16 0933 06/26/16 1333  BP: (!) 150/80 122/73  Pulse: 97 85  Resp: 18 14  Temp: 36.6 C 36.2 C    Last Pain:  Vitals:   06/26/16 0933  TempSrc: Oral                 Suresh Audi K

## 2016-06-26 NOTE — Anesthesia Preprocedure Evaluation (Signed)
Anesthesia Evaluation  Patient identified by MRN, date of birth, ID band Patient awake    Reviewed: Allergy & Precautions, NPO status , Patient's Chart, lab work & pertinent test results  History of Anesthesia Complications (+) AWARENESS UNDER ANESTHESIA and history of anesthetic complications (pt with awareness during a bilateral hip replacement)  Airway Mallampati: II       Dental   Pulmonary neg pulmonary ROS, former smoker,           Cardiovascular negative cardio ROS       Neuro/Psych Anxiety negative neurological ROS     GI/Hepatic negative GI ROS, Neg liver ROS,   Endo/Other  diabetes ("pre-dabetes")  Renal/GU negative Renal ROS     Musculoskeletal   Abdominal   Peds  Hematology   Anesthesia Other Findings   Reproductive/Obstetrics                            Anesthesia Physical Anesthesia Plan  ASA: II  Anesthesia Plan: General   Post-op Pain Management:    Induction: Intravenous  Airway Management Planned: Oral ETT  Additional Equipment:   Intra-op Plan:   Post-operative Plan:   Informed Consent: I have reviewed the patients History and Physical, chart, labs and discussed the procedure including the risks, benefits and alternatives for the proposed anesthesia with the patient or authorized representative who has indicated his/her understanding and acceptance.     Plan Discussed with:   Anesthesia Plan Comments:         Anesthesia Quick Evaluation

## 2016-06-26 NOTE — H&P (Addendum)
I have reviewed and confirmed my history and physical from clinic with no additions or changes. Plan for lumbar decompression.  Risks and benefits reviewed.   Heart sounds RRR no MRG Chest clear to auscultation bilaterally

## 2016-06-26 NOTE — Anesthesia Procedure Notes (Signed)
Procedure Name: Intubation Date/Time: 06/26/2016 11:20 AM Performed by: Jonna Clark Pre-anesthesia Checklist: Patient identified, Patient being monitored, Timeout performed, Emergency Drugs available and Suction available Patient Re-evaluated:Patient Re-evaluated prior to inductionOxygen Delivery Method: Circle system utilized Preoxygenation: Pre-oxygenation with 100% oxygen Intubation Type: IV induction Ventilation: Mask ventilation without difficulty Laryngoscope Size: Mac and 3 Grade View: Grade I Tube type: Oral Tube size: 7.5 mm Number of attempts: 1 Placement Confirmation: ETT inserted through vocal cords under direct vision,  positive ETCO2 and breath sounds checked- equal and bilateral Secured at: 21 cm Tube secured with: Tape Dental Injury: Teeth and Oropharynx as per pre-operative assessment

## 2016-06-26 NOTE — Transfer of Care (Signed)
Immediate Anesthesia Transfer of Care Note  Patient: Joe Tyler  Procedure(s) Performed: Procedure(s): LUMBAR LAMINECTOMY/DECOMPRESSION MICRODISCECTOMY 2 LEVELS L5-S1 (N/A)  Patient Location: PACU  Anesthesia Type:General  Level of Consciousness: awake, alert  and oriented  Airway & Oxygen Therapy: Patient Spontanous Breathing and Patient connected to face mask oxygen  Post-op Assessment: Report given to RN and Post -op Vital signs reviewed and stable  Post vital signs: Reviewed  Last Vitals:  Vitals:   06/26/16 0933 06/26/16 1333  BP: (!) 150/80 122/73  Pulse: 97 85  Resp: 18 14  Temp: 36.6 C 36.2 C    Last Pain:  Vitals:   06/26/16 0933  TempSrc: Oral         Complications: No apparent anesthesia complications

## 2016-06-26 NOTE — Op Note (Signed)
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  He failed conservative management, and elected for surgical intervention.  Findings: severe lumber stenosis  Preoperative Note:The patient's symptoms were confirmed, and risks and benefits reviewed.   Risks of surgery discussed include: infection, bleeding, stroke, coma, death, paralysis, CSF leak, nerve/spinal cord injury, numbness, tingling, weakness, complex regional pain syndrome, recurrent stenosis and/or disc herniation, vascular injury, development of instability, neck/back pain, need for further surgery, persistent symptoms, development of deformity, and the risks of anesthesia. They understood these risks and have agreed to proceed.  Operative Note:   Procedure:   1. L4-5 lumbar decompression including central laminectomy and bilateral medial facetectomies including foraminotomies 2. L5-S1 lumbar decompression including central laminectomy and bilateral medial facetectomies including foraminotomies   The patient was then brought from the preoperative center with intravenous access established.  The patient underwent general anesthesia and endotracheal tube intubation, and was then rotated on the [place] rail top where all pressure points were appropriately padded.  The skin was then thoroughly cleansed.  Perioperative antibiotic prophylaxis was administered.  Sterile prep and drapes were then applied and a timeout was then observed.  C-arm was brought into the field under sterile conditions and under lateral visualization the L4-5 and L5-S1 interspace was identified and marked.  The incision was marked on the left and injected with local anesthetic. Once this was complete a 2 cm incision was opened with the use of a #10 blade knife.  The metrx tubes were sequentially advanced and confirmed in position. An 99mm by 44mm tube was locked in place to the bed side attachment.   Fluoroscopy was then removed from the field.  The microscope was then sterilely brought into the field and muscle creep was hemostased with a bipolar and resected with a pituitary rongeur.  A Bovie extender was then used to expose the spinous process and lamina.  Careful attention was placed to not violate the facet capsule. A 3 mm matchstick drill bit was then used to make a hemi-laminotomy trough until the ligamentum flavum was exposed.  This was extended to the base of the spinous process and to the contralateral side to remove all the central bone from each side.  Once this was complete and the underlying ligamentum flavum was visualized, it was dissected with a curette and resected with Kerrison rongeurs.  Extensive ligamentum hypertrophy was noted, requiring a substantial amount of time and care for removal.  The dura was identified and palpated. The kerrison rongeur was then used to remove the medial facet bilaterally until no compression was noted.  A balltip probe was used to confirm decompression of the right L5 nerve root.  Additional attention was paid to completion of the left L4-5 foraminotomy until the left L5 nerve root was completely free.  Once this was complete, L4-5 central decompression including medial facetectomy and foraminotomy was confirmed and decompression on both sides was confirmed. A Depo-Medrol soaked Gelfoam pledget was placed in the defect at L4-5. Hemostasis was confirmed.  Attention was then paid to the L5-S1 level. The metrx tube was removed from L4-5. The metrx tubes were sequentially advanced and confirmed in position at L5-S1. An 51mm by 61mm tube was locked in place to the bed side attachment.  Fluoroscopy was then removed from the field.  The microscope was then sterilely brought into the field and muscle creep was hemostased with a bipolar and resected with a pituitary rongeur.  A Bovie extender was then used to expose the spinous process and lamina.  Careful attention  was placed to not violate the facet capsule. A 3 mm matchstick drill bit was then used to make a hemi-laminotomy trough until the ligamentum flavum was exposed.  This was extended to the base of the spinous process and to the contralateral side to remove all the central bone from each side.  Once this was complete and the underlying ligamentum flavum was visualized, it was dissected with a curette and resected with Kerrison rongeurs.  Extensive ligamentum hypertrophy was noted, requiring a substantial amount of time and care for removal.  The dura was identified and palpated. The kerrison rongeur was then used to remove the medial facet bilaterally until no compression was noted.  A balltip probe was used to confirm decompression of the right S1 nerve root.  Additional attention was paid to completion of the left L5-S1 foraminotomy until the left S1 nerve root was completely free.  Once this was complete, L5-S1 central decompression including medial facetectomy and foraminotomy was confirmed and decompression on both sides was confirmed.   No CSF leak was noted at either level . A Depo-Medrol soaked Gelfoam pledget was placed in the defect at L5-S1.  The wound was copiously irrigated. The tube system was then removed under microscopic visualization and hemostasis was obtained with a bipolar.  The fascial layer was reapproximated with the use of a 0 Vicryl suture.  Subcutaneous tissue layer was reapproximated using 2-0 Vicryl suture.  3-0 monocryl was placed in subcuticular fashion. The skin was then cleansed and Dermabond was used to close the skin opening.  Patient was then rotated back to the preoperative bed awakened from anesthesia and taken to recovery all counts are correct in this case.  I performed the entire procedure.  Nakiesha Rumsey K. Izora Ribas MD

## 2016-06-26 NOTE — Discharge Instructions (Signed)
Your surgeon has performed an operation on your lumbar spine (low back) to relieve pressure on one or more nerves. Many times, patients feel better immediately after surgery and can overdo it. Even if you feel well, it is important that you follow these activity guidelines. If you do not let your back heal properly from the surgery, you can increase the chance of a disc herniation and return of your symptoms. The following are instructions to help in your recovery once you have been discharged from the hospital.  * Do not take anti-inflammatory medications for 5 days after surgery (naproxen [Aleve], ibuprofen [Advil, Motrin], celecoxib [Celebrex], etc.)  Activity    No bending, lifting, or twisting (BLT). Avoid lifting objects heavier than 10 pounds (gallon milk jug).  Where possible, avoid household activities that involve lifting, bending, pushing, or pulling such as laundry, vacuuming, grocery shopping, and childcare. Try to arrange for help from friends and family for these activities while your back heals.  Increase physical activity slowly as tolerated.  Taking short walks is encouraged, but avoid strenuous exercise. Do not jog, run, bicycle, lift weights, or participate in any other exercises unless specifically allowed by your doctor. Avoid prolonged sitting, including car rides.  Talk to your doctor before resuming sexual activity.  You should not drive until cleared by your doctor.  Until released by your doctor, you should not return to work or school.  You should rest at home and let your body heal.   You may shower three days after your surgery.  After showering, lightly dab your incision dry. Do not take a tub bath or go swimming until approved by your doctor at your follow-up appointment.  If you smoke, we strongly recommend that you quit.  Smoking has been proven to interfere with normal healing in your back and will dramatically reduce the success rate of your surgery. Please  contact [facility] (800-QUIT-NOW) and use the resources at www.[facility].com for assistance in stopping smoking.  Surgical Incision   If you have a dressing on your incision, you may remove it 4 days after your surgery. Keep your incision area clean and dry.  If you have staples or stitches on your incision, you should have a follow up scheduled for removal. If you do not have staples or stitches, you will have steri-strips (small pieces of surgical tape) or Dermabond glue. The steri-strips/glue should begin to peel away within about a week (it is fine if the steri-strips fall off before then). If the strips are still in place one week after your surgery, you may gently remove them.  Diet            You may return to your usual diet. Be sure to stay hydrated.  When to Contact us  Although your surgery and recovery will likely be uneventful, you may have some residual numbness, aches, and pains in your back and/or legs. This is normal and should improve in the next few weeks.  However, should you experience any of the following, contact us immediately:  New numbness or weakness  Pain that is progressively getting worse, and is not relieved by your pain medications or rest  Bleeding, redness, swelling, pain, or drainage from surgical incision  Chills or flu-like symptoms  Fever greater than 101.0 F (38.3 C)  Problems with bowel or bladder functions  Difficulty breathing or shortness of breath  Warmth, tenderness, or swelling in your calf  Contact Information  During office hours (Monday-Friday 9 am to 5  pm), please call your physician at [phone]  After hours and weekends, please call the [place] Operator at [phone] and ask for the Neurosurgery Resident On Call   For a life-threatening emergency, call 911

## 2016-06-27 DIAGNOSIS — M48062 Spinal stenosis, lumbar region with neurogenic claudication: Secondary | ICD-10-CM | POA: Diagnosis not present

## 2016-06-27 MED FILL — Bacitracin Intramuscular For Soln 50000 Unit: INTRAMUSCULAR | Qty: 1 | Status: AC

## 2016-06-27 NOTE — Care Management Obs Status (Signed)
[facility] NOTIFICATION   Patient Details  Name: Joe Tyler MRN: [id number] Date of Birth: [birth year]   Medicare Observation Status Notification Given:  Yes    Jolly Mango, RN 06/27/2016, 9:30 AM

## 2016-06-27 NOTE — Progress Notes (Signed)
    Attending Progress Note  History: Joe Tyler is here for lumbar stenosis. He is POD1 and doing very well. He walked in the hallways.  Physical Exam: Vitals:   06/27/16 0347 06/27/16 0749  BP: 119/65 119/66  Pulse: 97 83  Resp: 18   Temp: 98.7 F (37.1 C) 98.2 F (36.8 C)    AA Ox3 CNI  Strength:5/5 throughout BLE. Incision c/d/i  Data:  No results for input(s): NA, K, CL, CO2, BUN, CREATININE, LABGLOM, GLUCOSE, CALCIUM in the last 168 hours. No results for input(s): AST, ALT, ALKPHOS in the last 168 hours.  Invalid input(s): TBILI   No results for input(s): WBC, HGB, HCT, PLT in the last 168 hours. No results for input(s): APTT, INR in the last 168 hours.       Other tests/results: none  Assessment/Plan:  Joe Tyler has lumbar stenosis and is doing very well.  - mobilize - pain control   Meade Maw MD, [facility] Department of Neurosurgery

## 2016-06-27 NOTE — Progress Notes (Signed)
Patient is being discharged home with self care. Wife at bedside during discharge instructions. Allowed time for questions. 2 IVs removed with cath intact. Script given to patient to fill.

## 2016-06-27 NOTE — Discharge Summary (Signed)
Physician Discharge Summary  Patient ID: Joe Tyler MRN: [id number] DOB/AGE: 1940-10-21 76 y.o.  Admit date: 06/26/2016 Discharge date: 06/27/2016  Admission Diagnoses:  Discharge Diagnoses:  Active Problems:   Lumbar stenosis with neurogenic claudication   Discharged Condition: good  Hospital Course: Mr. Tortoriello was admitted for lumbar stenosis with neurogenic claudication. He underwent 2 level lumbar decompression, and did very well. On POD1, he was eating, drinking, and had appropriate pain control. He walked in the hallways, and was stable for discharge.  Consults: None  Significant Diagnostic Studies: none  Treatments: surgery: L4-5 and L5-S1 decompression  Discharge Exam: Blood pressure 119/66, pulse 83, temperature 98.2 F (36.8 C), temperature source Oral, resp. rate 18, height 5\' 11"  (1.803 m), weight 97.1 kg (214 lb), SpO2 94 %. General appearance: alert   AAOx3 CNI 5/5 t/o Incision c/d/i  Disposition: 01-Home or Self Care  Discharge Instructions    Incentive spirometry RT    Complete by:  As directed      Allergies as of 06/27/2016      Reactions   Levaquin [levofloxacin] Other (See Comments), Anxiety   Possible panic disorder Panic Disorder      Medication List    TAKE these medications   allopurinol 300 MG tablet Commonly known as:  ZYLOPRIM Take 300 mg by mouth daily.   ALPRAZolam 0.25 MG tablet Commonly known as:  XANAX Take 0.25 mg by mouth 3 (three) times daily as needed for anxiety.   amoxicillin 500 MG tablet Commonly known as:  AMOXIL Take 2,000 mg by mouth. Four tabs prior to any dental procedure   gabapentin 100 MG capsule Commonly known as:  NEURONTIN Take 200 mg by mouth 3 (three) times daily.   HYDROcodone-acetaminophen 5-325 MG tablet Commonly known as:  NORCO Take 1 tablet by mouth every 6 (six) hours as needed for moderate pain.   ICAPS AREDS 2 PO Take 2 capsules by mouth daily.   methocarbamol 500 MG  tablet Commonly known as:  ROBAXIN Take 1 tablet (500 mg total) by mouth 4 (four) times daily.   tobramycin 0.3 % ophthalmic solution Commonly known as:  TOBREX Place 1 drop into the left eye See admin instructions. Uses 4 times daily prior to eye injection every 8 weeks.  Next one due 07-2016      Follow-up Information    Meade Maw, MD In 4 weeks.   Specialty:  Neurosurgery Contact information: [place] [address] [phone]           Signed: Meade Maw 06/27/2016, 8:06 AM

## 2017-06-23 ENCOUNTER — Other Ambulatory Visit: Payer: Self-pay | Admitting: Physical Medicine and Rehabilitation

## 2017-06-23 DIAGNOSIS — M5412 Radiculopathy, cervical region: Secondary | ICD-10-CM

## 2017-06-26 ENCOUNTER — Ambulatory Visit
Admission: RE | Admit: 2017-06-26 | Discharge: 2017-06-26 | Disposition: A | Discharge status: Home / Self Care | Payer: Medicare Other | Source: Ambulatory Visit | Attending: Physical Medicine and Rehabilitation | Admitting: Physical Medicine and Rehabilitation

## 2017-06-26 DIAGNOSIS — M47812 Spondylosis without myelopathy or radiculopathy, cervical region: Secondary | ICD-10-CM | POA: Diagnosis not present

## 2017-06-26 DIAGNOSIS — M1288 Other specific arthropathies, not elsewhere classified, other specified site: Secondary | ICD-10-CM | POA: Diagnosis not present

## 2017-06-26 DIAGNOSIS — M4312 Spondylolisthesis, cervical region: Secondary | ICD-10-CM | POA: Insufficient documentation

## 2017-06-26 DIAGNOSIS — M5412 Radiculopathy, cervical region: Secondary | ICD-10-CM | POA: Insufficient documentation

## 2017-06-26 DIAGNOSIS — M4802 Spinal stenosis, cervical region: Secondary | ICD-10-CM | POA: Diagnosis not present

## 2017-10-29 ENCOUNTER — Other Ambulatory Visit: Payer: Self-pay | Admitting: Family Medicine

## 2017-10-29 DIAGNOSIS — M5416 Radiculopathy, lumbar region: Secondary | ICD-10-CM

## 2017-10-31 ENCOUNTER — Ambulatory Visit
Admission: RE | Admit: 2017-10-31 | Discharge: 2017-10-31 | Disposition: A | Discharge status: Home / Self Care | Payer: Medicare Other | Source: Ambulatory Visit | Attending: Family Medicine | Admitting: Family Medicine

## 2017-10-31 DIAGNOSIS — M5416 Radiculopathy, lumbar region: Secondary | ICD-10-CM | POA: Diagnosis present

## 2017-10-31 DIAGNOSIS — M48061 Spinal stenosis, lumbar region without neurogenic claudication: Secondary | ICD-10-CM | POA: Insufficient documentation

## 2018-01-20 ENCOUNTER — Encounter: Payer: Self-pay | Admitting: Emergency Medicine

## 2018-01-20 ENCOUNTER — Ambulatory Visit
Admission: EM | Admit: 2018-01-20 | Discharge: 2018-01-20 | Disposition: A | Discharge status: Home / Self Care | Payer: Medicare Other | Attending: Family Medicine | Admitting: Family Medicine

## 2018-01-20 ENCOUNTER — Other Ambulatory Visit: Payer: Self-pay

## 2018-01-20 DIAGNOSIS — T63441A Toxic effect of venom of bees, accidental (unintentional), initial encounter: Secondary | ICD-10-CM | POA: Diagnosis not present

## 2018-01-20 NOTE — Discharge Instructions (Signed)
Rest.  Benadryl if needed.  Take care  Dr. Lacinda Axon

## 2018-01-20 NOTE — ED Provider Notes (Signed)
[facility]    CSN: [id number] Arrival date & time: 01/20/18  0919  History   Chief Complaint Chief Complaint  Patient presents with  . Insect Bite    bee    HPI  77 year old male presents with bee stings.  Patient states that he was stung by suspected wasps this morning.  He was stung on his scalp as well as his left third finger.  Mild pain and swelling.  He reports that his scalp feels like it is burning.  No medication or interventions tried.  No shortness of breath.  No chest pain.  No other associated symptoms.  No other complaints or concerns at this time.  Past Medical History:  Diagnosis Date  . Anxiety    Panic Attack  . Arthritis   . Diabetes mellitus without complication ([place])   . Gout   . Hyperlipidemia   . 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 Shingles   . Spinal stenosis     Patient Active Problem List   Diagnosis Date Noted  . 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  . HLD (hyperlipidemia) 05/04/2015  . Arthritis, degenerative 05/04/2015  . 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 Spinal stenosis 05/04/2015  . Chronic gouty arthritis 03/03/2015    Past Surgical History:  Procedure Laterality Date  . benign tumor removed Right arm  . BILATERAL HIP ARTHROSCOPY    . COLONOSCOPY WITH PROPOFOL N/A 04/07/2015   Procedure: COLONOSCOPY WITH PROPOFOL;  Surgeon: Manya Silvas, MD;  Location: [facility] ENDOSCOPY;  Service: Endoscopy;  Laterality: N/A;  . ESOPHAGOGASTRODUODENOSCOPY (EGD) WITH PROPOFOL N/A 04/07/2015   Procedure: ESOPHAGOGASTRODUODENOSCOPY (EGD) WITH PROPOFOL;  Surgeon: Manya Silvas, MD;  Location: [facility] ENDOSCOPY;  Service: Endoscopy;  Laterality: N/A;  . JOINT REPLACEMENT    . LUMBAR LAMINECTOMY/DECOMPRESSION MICRODISCECTOMY N/A 06/26/2016   Procedure: LUMBAR LAMINECTOMY/DECOMPRESSION MICRODISCECTOMY 2 LEVELS L5-S1;  Surgeon: Meade Maw, MD;  Location: [facility] ORS;  Service: Neurosurgery;  Laterality: N/A;  .  TONSILLECTOMY         Home Medications    Prior to Admission medications   Medication Sig Start Date End Date Taking? Authorizing Provider  allopurinol (ZYLOPRIM) 300 MG tablet Take 300 mg by mouth daily.   Yes [provider]  ALPRAZolam (XANAX) 0.25 MG tablet Take 0.25 mg by mouth 3 (three) times daily as needed for anxiety.   Yes [provider]  gabapentin (NEURONTIN) 100 MG capsule Take 200 mg by mouth 3 (three) times daily.   Yes [provider]  Multiple Vitamins-Minerals (ICAPS AREDS 2 PO) Take 2 capsules by mouth daily.   Yes [provider]  amoxicillin (AMOXIL) 500 MG tablet Take 2,000 mg by mouth. Four tabs prior to any dental procedure    [provider]  HYDROcodone-acetaminophen (NORCO) 5-325 MG tablet Take 1 tablet by mouth every 6 (six) hours as needed for moderate pain. 06/26/16   Meade Maw, MD  methocarbamol (ROBAXIN) 500 MG tablet Take 1 tablet (500 mg total) by mouth 4 (four) times daily. 06/26/16   Meade Maw, MD  tobramycin (TOBREX) 0.3 % ophthalmic solution Place 1 drop into the left eye See admin instructions. Uses 4 times daily prior to eye injection every 8 weeks.  Next one due 07-2016 04/09/16   [provider]    Family History Family History  Problem Relation Age of Onset  . Pancreatic cancer Mother   . Lung cancer Paternal Uncle   . Ovarian cancer Maternal Grandmother     Social  History Social History   Tobacco Use  . Smoking status: Former Research scientist (life sciences)  . Smokeless tobacco: Never Used  Substance Use Topics  . Alcohol use: Yes  . Drug use: No     Allergies   Levaquin [levofloxacin]   Review of Systems Review of Systems  Constitutional: Negative.   Respiratory: Negative.   Skin:       Bee stings.   Physical Exam Triage Vital Signs ED Triage Vitals  Enc Vitals Group     BP 01/20/18 0929 (!) 165/94     Pulse Rate 01/20/18 0929 100     Resp 01/20/18 0929 16     Temp  01/20/18 0929 97.6 F (36.4 C)     Temp Source 01/20/18 0929 Oral     SpO2 01/20/18 0929 98 %     Weight 01/20/18 0926 217 lb (98.4 kg)     Height 01/20/18 0926 5\' 11"  (1.803 m)     Head Circumference --      Peak Flow --      Pain Score 01/20/18 0926 3     Pain Loc --      Pain Edu? --      Excl. in [place]? --    Updated Vital Signs BP (!) 165/94 (BP Location: Right Arm)   Pulse 100   Temp 97.6 F (36.4 C) (Oral)   Resp 16   Ht 5\' 11"  (1.803 m)   Wt 217 lb (98.4 kg)   SpO2 98%   BMI 30.27 kg/m   Visual Acuity Right Eye Distance:   Left Eye Distance:   Bilateral Distance:    Right Eye Near:   Left Eye Near:    Bilateral Near:     Physical Exam  Constitutional: He is oriented to person, place, and time. He appears well-developed. No distress.  Cardiovascular: Normal rate and regular rhythm.  Pulmonary/Chest: Effort normal and breath sounds normal. He has no wheezes. He has no rales.  Neurological: He is alert and oriented to person, place, and time.  Skin:  Patient with raised, erythematous areas on his scalp.  He has a small area on his finger as well.   Psychiatric: He has a normal mood and affect. His behavior is normal.  Nursing note and vitals reviewed.  UC Treatments / Results  Labs (all labs ordered are listed, but only abnormal results are displayed) Labs Reviewed - No data to display  EKG None  Radiology No results found.  Procedures Procedures (including critical care time)  Medications Ordered in UC Medications - No data to display  Initial Impression / Assessment and Plan / UC Course  I have reviewed the triage vital signs and the nursing notes.  Pertinent labs & imaging results that were available during my care of the patient were reviewed by me and considered in my medical decision making (see chart for details).    77 year old male presents with bee stings.  Well-appearing.  No evidence of anaphylaxis.  Advised supportive care and Benadryl  as needed.  Final Clinical Impressions(s) / UC Diagnoses   Final diagnoses:  Bee sting, accidental or unintentional, initial encounter     Discharge Instructions     Rest.  Benadryl if needed.  Take care  Dr. Lacinda Axon     ED Prescriptions    None     Controlled Substance Prescriptions [place] Controlled Substance Registry consulted? Not Applicable   Coral Spikes, DO 01/20/18 1012

## 2018-01-20 NOTE — ED Triage Notes (Signed)
Patient c/o wasp sting to his head and left 3rd finger this morning.

## 2018-03-06 DIAGNOSIS — N401 Enlarged prostate with lower urinary tract symptoms: Secondary | ICD-10-CM | POA: Insufficient documentation

## 2018-05-25 ENCOUNTER — Other Ambulatory Visit: Payer: Self-pay | Admitting: Student

## 2018-05-25 DIAGNOSIS — M5412 Radiculopathy, cervical region: Secondary | ICD-10-CM

## 2018-06-11 ENCOUNTER — Ambulatory Visit: Admission: RE | Admit: 2018-06-11 | Payer: Medicare Other | Source: Ambulatory Visit

## 2018-06-11 ENCOUNTER — Ambulatory Visit
Admission: RE | Admit: 2018-06-11 | Discharge: 2018-06-11 | Disposition: A | Discharge status: Home / Self Care | Payer: Medicare Other | Source: Ambulatory Visit | Attending: Student | Admitting: Student

## 2018-06-11 DIAGNOSIS — M542 Cervicalgia: Secondary | ICD-10-CM | POA: Diagnosis not present

## 2018-06-11 DIAGNOSIS — M47812 Spondylosis without myelopathy or radiculopathy, cervical region: Secondary | ICD-10-CM | POA: Insufficient documentation

## 2018-06-11 DIAGNOSIS — M4802 Spinal stenosis, cervical region: Secondary | ICD-10-CM | POA: Insufficient documentation

## 2018-06-11 DIAGNOSIS — M5412 Radiculopathy, cervical region: Secondary | ICD-10-CM | POA: Insufficient documentation

## 2018-06-30 DIAGNOSIS — R200 Anesthesia of skin: Secondary | ICD-10-CM | POA: Insufficient documentation

## 2018-06-30 DIAGNOSIS — R202 Paresthesia of skin: Secondary | ICD-10-CM | POA: Insufficient documentation

## 2018-08-18 DIAGNOSIS — R200 Anesthesia of skin: Secondary | ICD-10-CM | POA: Insufficient documentation

## 2018-08-18 DIAGNOSIS — M79641 Pain in right hand: Secondary | ICD-10-CM | POA: Insufficient documentation

## 2018-08-18 DIAGNOSIS — M79642 Pain in left hand: Secondary | ICD-10-CM | POA: Insufficient documentation

## 2018-08-18 DIAGNOSIS — M542 Cervicalgia: Secondary | ICD-10-CM | POA: Insufficient documentation

## 2018-12-17 DIAGNOSIS — M25511 Pain in right shoulder: Secondary | ICD-10-CM | POA: Insufficient documentation

## 2019-02-18 ENCOUNTER — Other Ambulatory Visit: Payer: Self-pay | Admitting: Sports Medicine

## 2019-02-18 DIAGNOSIS — G8929 Other chronic pain: Secondary | ICD-10-CM

## 2019-02-18 DIAGNOSIS — M7541 Impingement syndrome of right shoulder: Secondary | ICD-10-CM

## 2019-02-18 DIAGNOSIS — M778 Other enthesopathies, not elsewhere classified: Secondary | ICD-10-CM

## 2019-02-18 DIAGNOSIS — M7551 Bursitis of right shoulder: Secondary | ICD-10-CM

## 2019-02-28 ENCOUNTER — Ambulatory Visit
Admission: RE | Admit: 2019-02-28 | Discharge: 2019-02-28 | Disposition: A | Discharge status: Home / Self Care | Payer: Medicare Other | Source: Ambulatory Visit | Attending: Sports Medicine | Admitting: Sports Medicine

## 2019-02-28 DIAGNOSIS — M7581 Other shoulder lesions, right shoulder: Secondary | ICD-10-CM | POA: Diagnosis present

## 2019-02-28 DIAGNOSIS — M25511 Pain in right shoulder: Secondary | ICD-10-CM | POA: Insufficient documentation

## 2019-02-28 DIAGNOSIS — G8929 Other chronic pain: Secondary | ICD-10-CM | POA: Diagnosis present

## 2019-02-28 DIAGNOSIS — M778 Other enthesopathies, not elsewhere classified: Secondary | ICD-10-CM

## 2019-02-28 DIAGNOSIS — M7551 Bursitis of right shoulder: Secondary | ICD-10-CM | POA: Insufficient documentation

## 2019-02-28 DIAGNOSIS — M7541 Impingement syndrome of right shoulder: Secondary | ICD-10-CM | POA: Insufficient documentation

## 2019-09-22 ENCOUNTER — Ambulatory Visit (INDEPENDENT_AMBULATORY_CARE_PROVIDER_SITE_OTHER): Payer: Medicare Other | Admitting: Dermatology

## 2019-09-22 ENCOUNTER — Other Ambulatory Visit: Payer: Self-pay

## 2019-09-22 ENCOUNTER — Encounter: Payer: Self-pay | Admitting: Dermatology

## 2019-09-22 DIAGNOSIS — L821 Other seborrheic keratosis: Secondary | ICD-10-CM | POA: Diagnosis not present

## 2019-09-22 DIAGNOSIS — L578 Other skin changes due to chronic exposure to nonionizing radiation: Secondary | ICD-10-CM

## 2019-09-22 DIAGNOSIS — L570 Actinic keratosis: Secondary | ICD-10-CM | POA: Diagnosis not present

## 2019-09-22 DIAGNOSIS — L820 Inflamed seborrheic keratosis: Secondary | ICD-10-CM | POA: Diagnosis not present

## 2019-09-22 NOTE — Patient Instructions (Signed)

## 2019-09-22 NOTE — Progress Notes (Signed)
   Follow-Up Visit   Subjective  Joe Tyler is a 79 y.o. male who presents for the following: Actinic Keratosis (4 mos f/u face, scalp, arms. Pt had a PDT treatment on the scalp and forehead 3 months ago). He has other areas that are irritating for which she would like treatment.  He is other spots that he feels may be new areas.  The following portions of the chart were reviewed this encounter and updated as appropriate:     Review of Systems: No other skin or systemic complaints.  Objective  Well appearing patient in no apparent distress; mood and affect are within normal limits.  All skin waist up examined.  Objective  face, arms (9): Erythematous thin papules/macules with gritty scale.   Objective  Back (4): Erythematous keratotic or waxy stuck-on papule or plaque.   Assessment & Plan   Actinic Damage - diffuse scaly erythematous macules with underlying dyspigmentation - Recommend daily broad spectrum sunscreen SPF 30+ to sun-exposed areas, reapply every 2 hours as needed.  - Call for new or changing lesions. Seborrheic Keratoses - Stuck-on, waxy, tan-brown papules and plaques  - Discussed benign etiology and prognosis. - Observe - Call for any changes  AK (actinic keratosis) (9) face, arms  Destruction of lesion - face, arms Complexity: simple   Destruction method: cryotherapy   Informed consent: discussed and consent obtained   Timeout:  patient name, date of birth, surgical site, and procedure verified Lesion destroyed using liquid nitrogen: Yes   Region frozen until ice ball extended beyond lesion: Yes   Outcome: patient tolerated procedure well with no complications   Post-procedure details: wound care instructions given    Inflamed seborrheic keratosis (4) Back  Destruction of lesion - Back Complexity: simple   Destruction method: cryotherapy   Informed consent: discussed and consent obtained   Timeout:  patient name, date of birth, surgical site,  and procedure verified Lesion destroyed using liquid nitrogen: Yes   Region frozen until ice ball extended beyond lesion: Yes   Outcome: patient tolerated procedure well with no complications   Post-procedure details: wound care instructions given    Return in about 6 months (around 03/23/2020) for Aks .   IMarye Round, CMA, am acting as scribe for Sarina Ser, MD . ...........Marland KitchenDocumentation: I have reviewed the above documentation for accuracy and completeness, and I agree with the above.  Sarina Ser, MD

## 2020-02-23 ENCOUNTER — Ambulatory Visit (INDEPENDENT_AMBULATORY_CARE_PROVIDER_SITE_OTHER): Payer: Medicare Other | Admitting: Dermatology

## 2020-02-23 ENCOUNTER — Encounter: Payer: Self-pay | Admitting: Dermatology

## 2020-02-23 ENCOUNTER — Other Ambulatory Visit: Payer: Self-pay

## 2020-02-23 DIAGNOSIS — B351 Tinea unguium: Secondary | ICD-10-CM

## 2020-02-23 DIAGNOSIS — R238 Other skin changes: Secondary | ICD-10-CM

## 2020-02-23 NOTE — Patient Instructions (Signed)
Terbinafine Counseling ° °Terbinafine is an anti-fungal medicine that can be applied to the skin (over the counter) or taken by mouth (prescription) to treat fungal infections. The pill version is often used to treat fungal infections of the nails or scalp. While most people do not have any side effects from taking terbinafine pills, some possible side effects of the medicine can include taste changes, headache, loss of smell, vision changes, nausea, vomiting, or diarrhea.  ° °Rare side effects can include irritation of the liver, allergic reaction, or decrease in blood counts (which may show up as not feeling well or developing an infection). If you are concerned about any of these side effects, please stop the medicine and call your doctor, or in the case of an emergency such as feeling very unwell, seek immediate medical care.  ° °

## 2020-02-23 NOTE — Progress Notes (Signed)
° °  Follow-Up Visit   Subjective  Joe Tyler is a 79 y.o. male who presents for the following: Skin Problem (Great toes, bilateral and L-2 toe/toenail. Hx of pedicure August 26th. Has trouble with ingrown toenails. Noticed couple of days ago that the great toes are swollen and tender when pressed. Used Neosporin, no help.) and Nail Problem (Hx of nail fungus for years at both great toenails.).  He has a h/o gout and takes allopurinol, but redness/swelling in toes doesn't feel the same.    The following portions of the chart were reviewed this encounter and updated as appropriate:     Review of Systems: No other skin or systemic complaints except as noted in HPI or Assessment and Plan.  Objective  Well appearing patient in no apparent distress; mood and affect are within normal limits.  A focused examination was performed including head, including the scalp, face, neck, nose, ears, eyelids, and lips and feet. Relevant physical exam findings are noted in the Assessment and Plan.  Objective  Left 2nd Toe Nail Plate, Left Hallux Toe Nail Plate, Right Hallux Toe Nail Plate: Bilateral great toes, bilateral 4th toenails with thickening, yellow-brown discoloration, keratotic subungual debris.  Left 2nd toenail brown-black patch consistent with dried blood in nail.  Images      Objective  Left Hallux Interphalangeal Joint of Toe, Right Hallux Interphalangeal Joint of Toe: Mild erythema at bilateral medial great toes at joint.  Nontender. Started wearing new sandals 2 days ago which is when he noticed problem come up.  Assessment & Plan  Onychomycosis (3) Left Hallux Toe Nail Plate; Right Hallux Toe Nail Plate; Left 2nd Toe Nail Plate  Plan to start Lamisil 250 mg PO pending normal LFT labs for 3 month course. Patient to have labs performed at PCP office next week. ([facility] [place]).  Can take a year for great toenail to grow out.  Treatment may not completely clear nail dystrophy.   Recheck Left 2nd toe dark spot (heme).  Discussed how to trim toenails to avoid painful ingrown nails. Lift up/loosen edges of nail daily after soaking/showering  Terbinafine Counseling  Terbinafine is an anti-fungal medicine that can be applied to the skin (over the counter) or taken by mouth (prescription) to treat fungal infections. The pill version is often used to treat fungal infections of the nails or scalp. While most people do not have any side effects from taking terbinafine pills, some possible side effects of the medicine can include taste changes, headache, loss of smell, vision changes, nausea, vomiting, or diarrhea.   Rare side effects can include irritation of the liver, allergic reaction, or decrease in blood counts (which may show up as not feeling well or developing an infection). If you are concerned about any of these side effects, please stop the medicine and call your doctor, or in the case of an emergency such as feeling very unwell, seek immediate medical care.    Other Related Procedures Hepatic function panel  Skin irritation (2) Left Hallux Interphalangeal Joint of Toe; Right Hallux Interphalangeal Joint of Toe  Likely from pressure point from new shoes.  Try changing shoes or add cushioned pads to pressure points. Benign. Observe.  Return in about 6 weeks (around 04/05/2020) for toenail recheck.   I, Emelia Salisbury, CMA, am acting as scribe for Brendolyn Patty, MD.  Documentation: I have reviewed the above documentation for accuracy and completeness, and I agree with the above.  Brendolyn Patty MD

## 2020-03-06 ENCOUNTER — Telehealth: Payer: Self-pay

## 2020-03-06 ENCOUNTER — Encounter: Payer: Self-pay | Admitting: Dermatology

## 2020-03-06 MED ORDER — TERBINAFINE HCL 250 MG PO TABS: 250.0000 mg | ORAL_TABLET | Freq: Every day | ORAL | 0 refills | Status: DC

## 2020-03-06 NOTE — Telephone Encounter (Signed)
Advised pt outside labs WNL and Lamisil 250mg  1 po qd #30, 0rf would be sent to [facility] in [place]./sh

## 2020-03-06 NOTE — Telephone Encounter (Signed)
Talked to patient and advised him that Dr Nicole Kindred reviewed labs on [facility], normal. Will send in terbinafine 250mg  1 PO qd dsp #30 0Rf to [facility] in [place]. Patient to keep follow-up appt.

## 2020-03-23 ENCOUNTER — Other Ambulatory Visit: Payer: Self-pay

## 2020-03-23 ENCOUNTER — Ambulatory Visit (INDEPENDENT_AMBULATORY_CARE_PROVIDER_SITE_OTHER): Payer: Medicare Other | Admitting: Dermatology

## 2020-03-23 DIAGNOSIS — L820 Inflamed seborrheic keratosis: Secondary | ICD-10-CM | POA: Diagnosis not present

## 2020-03-23 DIAGNOSIS — L821 Other seborrheic keratosis: Secondary | ICD-10-CM | POA: Diagnosis not present

## 2020-03-23 DIAGNOSIS — L570 Actinic keratosis: Secondary | ICD-10-CM

## 2020-03-23 DIAGNOSIS — L578 Other skin changes due to chronic exposure to nonionizing radiation: Secondary | ICD-10-CM

## 2020-03-23 NOTE — Progress Notes (Signed)
   Follow-Up Visit   Subjective  Joe Tyler is a 79 y.o. male who presents for the following: Follow-up (AK follow up of face treated with LN2 x 9. ISK folow up of back treated with LN2 x 4) and Other (Bumps behid right knee that feel scaly.). The patient presents for Upper Body Skin Exam (UBSE) for skin cancer screening and mole check.  The following portions of the chart were reviewed this encounter and updated as appropriate:  Tobacco  Allergies  Meds  Problems  Med Hx  Surg Hx  Fam Hx     Review of Systems:  No other skin or systemic complaints except as noted in HPI or Assessment and Plan.  Objective  Well appearing patient in no apparent distress; mood and affect are within normal limits.  All skin waist up examined.  Objective  Back, shoulders, chest, right leg (24): Erythematous keratotic or waxy stuck-on papule or plaque.   Objective  Face, scalp, arms (21): Erythematous thin papules/macules with gritty scale.    Assessment & Plan    Actinic Damage - diffuse scaly erythematous macules with underlying dyspigmentation - Recommend daily broad spectrum sunscreen SPF 30+ to sun-exposed areas, reapply every 2 hours as needed.  - Call for new or changing lesions.  Seborrheic Keratoses - Stuck-on, waxy, tan-brown papules and plaques  - Discussed benign etiology and prognosis. 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  I, Ashok Cordia, CMA, am acting as scribe for Sarina Ser, MD .  Documentation: I have reviewed the above documentation for accuracy and completeness, and I agree with the above.  Sarina Ser, MD

## 2020-03-23 NOTE — Patient Instructions (Signed)

## 2020-03-24 ENCOUNTER — Encounter: Payer: Self-pay | Admitting: Dermatology

## 2020-04-05 ENCOUNTER — Ambulatory Visit (INDEPENDENT_AMBULATORY_CARE_PROVIDER_SITE_OTHER): Payer: Medicare Other | Admitting: Dermatology

## 2020-04-05 ENCOUNTER — Other Ambulatory Visit: Payer: Self-pay

## 2020-04-05 DIAGNOSIS — L820 Inflamed seborrheic keratosis: Secondary | ICD-10-CM

## 2020-04-05 DIAGNOSIS — L821 Other seborrheic keratosis: Secondary | ICD-10-CM

## 2020-04-05 DIAGNOSIS — L578 Other skin changes due to chronic exposure to nonionizing radiation: Secondary | ICD-10-CM | POA: Diagnosis not present

## 2020-04-05 NOTE — Progress Notes (Signed)
   Follow-Up Visit   Subjective  Joe Tyler is a 79 y.o. male who presents for the following: Rash (of lower back that just popped up last night and felt like a bee sting.).  The following portions of the chart were reviewed this encounter and updated as appropriate:  Tobacco  Allergies  Meds  Problems  Med Hx  Surg Hx  Fam Hx     Review of Systems:  No other skin or systemic complaints except as noted in HPI or Assessment and Plan.  Objective  Well appearing patient in no apparent distress; mood and affect are within normal limits.  A focused examination was performed including back. Relevant physical exam findings are noted in the Assessment and Plan.  Objective  Low back: Erythematous keratotic or waxy stuck-on papule or plaque.    Assessment & Plan    Actinic Damage - diffuse scaly erythematous macules with underlying dyspigmentation - Recommend daily broad spectrum sunscreen SPF 30+ to sun-exposed areas, reapply every 2 hours as needed.  - Call for new or changing lesions.   Seborrheic Keratoses - Stuck-on, waxy, tan-brown papules and plaques  - Discussed benign etiology and prognosis. - Observe - Call for any changes  Inflamed seborrheic keratosis Low back  Destruction of lesion - Low back Complexity: simple   Destruction method: cryotherapy   Informed consent: discussed and consent obtained   Timeout:  patient name, date of birth, surgical site, and procedure verified Lesion destroyed using liquid nitrogen: Yes   Region frozen until ice ball extended beyond lesion: Yes   Outcome: patient tolerated procedure well with no complications   Post-procedure details: wound care instructions given    Return for Follow up as scheduled.  Doylene Bode, CMA, am acting as scribe for Sarina Ser, MD .  Documentation: I have reviewed the above documentation for accuracy and completeness, and I agree with the above.  Sarina Ser, MD

## 2020-04-05 NOTE — Patient Instructions (Signed)

## 2020-04-10 ENCOUNTER — Encounter: Payer: Self-pay | Admitting: Dermatology

## 2020-04-10 ENCOUNTER — Other Ambulatory Visit: Payer: Self-pay

## 2020-04-10 ENCOUNTER — Ambulatory Visit (INDEPENDENT_AMBULATORY_CARE_PROVIDER_SITE_OTHER): Payer: Medicare Other | Admitting: Dermatology

## 2020-04-10 DIAGNOSIS — B351 Tinea unguium: Secondary | ICD-10-CM | POA: Diagnosis not present

## 2020-04-10 DIAGNOSIS — D692 Other nonthrombocytopenic purpura: Secondary | ICD-10-CM

## 2020-04-10 MED ORDER — TERBINAFINE HCL 250 MG PO TABS: 250.0000 mg | ORAL_TABLET | Freq: Every day | ORAL | 1 refills | Status: DC

## 2020-04-10 NOTE — Progress Notes (Signed)
   Follow-Up Visit   Subjective  Joe Tyler is a 79 y.o. male who presents for the following: onychomycosis (6wk f/u, bil great toenails, Terbinafine 250mg  1 po qd x 45m).  Tolerating well, no side effects.   The following portions of the chart were reviewed this encounter and updated as appropriate:      Review of Systems:  No other skin or systemic complaints except as noted in HPI or Assessment and Plan.  Objective  Well appearing patient in no apparent distress; mood and affect are within normal limits.  A focused examination was performed including bil feet/toenails. Relevant physical exam findings are noted in the Assessment and Plan.  Objective  toenails: Bil great toe with thickening yellow brown discoloration sub ungual debris, L 2nd toe with dark brown discoloration clear nail at proximal edge, thickening on R 4th toenail   Assessment & Plan  Onychomycosis toenails  With Purpura L 2nd toenail- appears to be growing out, will continue to monitor. Cont Lamisil 250mg  1 po qd #30, 1rf  Discussed gt toenails can take a year to grow out   Labs reviewed from [facility] baseline LFTs wnl   Terbinafine Counseling  Terbinafine is an anti-fungal medicine that can be applied to the skin (over the counter) or taken by mouth (prescription) to treat fungal infections. The pill version is often used to treat fungal infections of the nails or scalp. While most people do not have any side effects from taking terbinafine pills, some possible side effects of the medicine can include taste changes, headache, loss of smell, vision changes, nausea, vomiting, or diarrhea.   Rare side effects can include irritation of the liver, allergic reaction, or decrease in blood counts (which may show up as not feeling well or developing an infection). If you are concerned about any of these side effects, please stop the medicine and call your doctor, or in the case of an emergency such as feeling  very unwell, seek immediate medical care.    terbinafine (LAMISIL) 250 MG tablet - toenails  Return in about 3 months (around 07/11/2020) for onychomycosis.   I, Othelia Pulling, RMA, am acting as scribe for Brendolyn Patty, MD .  Documentation: I have reviewed the above documentation for accuracy and completeness, and I agree with the above.  Brendolyn Patty MD

## 2020-05-12 ENCOUNTER — Encounter: Payer: Self-pay | Admitting: Dermatology

## 2020-06-05 ENCOUNTER — Other Ambulatory Visit: Payer: Self-pay | Admitting: Nurse Practitioner

## 2020-06-05 DIAGNOSIS — M5412 Radiculopathy, cervical region: Secondary | ICD-10-CM

## 2020-06-05 DIAGNOSIS — M545 Low back pain, unspecified: Secondary | ICD-10-CM

## 2020-06-06 ENCOUNTER — Encounter: Payer: Self-pay | Admitting: Dermatology

## 2020-06-19 ENCOUNTER — Other Ambulatory Visit: Payer: Self-pay

## 2020-06-19 ENCOUNTER — Ambulatory Visit
Admission: RE | Admit: 2020-06-19 | Discharge: 2020-06-19 | Disposition: A | Discharge status: Home / Self Care | Payer: Medicare Other | Source: Ambulatory Visit | Attending: Nurse Practitioner | Admitting: Nurse Practitioner

## 2020-06-19 DIAGNOSIS — M5412 Radiculopathy, cervical region: Secondary | ICD-10-CM | POA: Diagnosis present

## 2020-06-19 DIAGNOSIS — G8929 Other chronic pain: Secondary | ICD-10-CM | POA: Insufficient documentation

## 2020-06-19 DIAGNOSIS — M545 Low back pain, unspecified: Secondary | ICD-10-CM

## 2020-07-11 ENCOUNTER — Other Ambulatory Visit: Payer: Self-pay

## 2020-07-11 ENCOUNTER — Ambulatory Visit (INDEPENDENT_AMBULATORY_CARE_PROVIDER_SITE_OTHER): Payer: Medicare Other | Admitting: Dermatology

## 2020-07-11 DIAGNOSIS — B351 Tinea unguium: Secondary | ICD-10-CM | POA: Diagnosis not present

## 2020-07-11 NOTE — Progress Notes (Signed)
   Follow-Up Visit   Subjective  Joe Tyler is a 80 y.o. male who presents for the following: Nail Problem (Onychomycosis of the toenails. Improving with terbinafine and Fungi Nail. Patient has completed almost 3 months of oral.).   The following portions of the chart were reviewed this encounter and updated as appropriate:       Review of Systems:  No other skin or systemic complaints except as noted in HPI or Assessment and Plan.  Objective  Well appearing patient in no apparent distress; mood and affect are within normal limits.  A focused examination was performed including face, feet, toenails. Relevant physical exam findings are noted in the Assessment and Plan.  Objective  toenails: Distal thickening, discoloration with keratotic subungual debris with ingrown nail of the bil great toenails, bil 4th toenails; purpura on L 2nd growing out compared to previous photos.    Assessment & Plan  Onychomycosis toenails  Some improvement noted, not at goal Pending labs, will continue terbinanafine 250mg  1 po qd #30 0Rf (4 months total) Will order LFTs labs since he has had some nausea with medication (improves with food). May continue Fungi Nail as recommended by podiatrist.  Discussed gt toenails can take a year to grow out.  Medication may not completely clear nails, especially in more severe cases.  Terbinafine Counseling  Terbinafine is an anti-fungal medicine that can be applied to the skin (over the counter) or taken by mouth (prescription) to treat fungal infections. The pill version is often used to treat fungal infections of the nails or scalp. While most people do not have any side effects from taking terbinafine pills, some possible side effects of the medicine can include taste changes, headache, loss of smell, vision changes, nausea, vomiting, or diarrhea.   Rare side effects can include irritation of the liver, allergic reaction, or decrease in blood counts (which  may show up as not feeling well or developing an infection). If you are concerned about any of these side effects, please stop the medicine and call your doctor, or in the case of an emergency such as feeling very unwell, seek immediate medical care.    Other Related Procedures Hepatic Function Panel  Other Related Medications terbinafine (LAMISIL) 250 MG tablet  Return in about 4 months (around 11/08/2020) for onychomycosis.   IJamesetta Orleans, CMA, am acting as scribe for Brendolyn Patty, MD .  Documentation: I have reviewed the above documentation for accuracy and completeness, and I agree with the above.  Brendolyn Patty MD

## 2020-07-11 NOTE — Patient Instructions (Signed)
Terbinafine Counseling ° °Terbinafine is an anti-fungal medicine that can be applied to the skin (over the counter) or taken by mouth (prescription) to treat fungal infections. The pill version is often used to treat fungal infections of the nails or scalp. While most people do not have any side effects from taking terbinafine pills, some possible side effects of the medicine can include taste changes, headache, loss of smell, vision changes, nausea, vomiting, or diarrhea.  ° °Rare side effects can include irritation of the liver, allergic reaction, or decrease in blood counts (which may show up as not feeling well or developing an infection). If you are concerned about any of these side effects, please stop the medicine and call your doctor, or in the case of an emergency such as feeling very unwell, seek immediate medical care.  ° °

## 2020-07-12 ENCOUNTER — Telehealth: Payer: Self-pay

## 2020-07-12 LAB — HEPATIC FUNCTION PANEL
ALT: 22 IU/L (ref 0–44)
AST: 20 IU/L (ref 0–40)
Albumin: 4.7 g/dL (ref 3.7–4.7)
Alkaline Phosphatase: 99 IU/L (ref 44–121)
Bilirubin Total: 0.3 mg/dL (ref 0.0–1.2)
Bilirubin, Direct: 0.11 mg/dL (ref 0.00–0.40)
Total Protein: 6.9 g/dL (ref 6.0–8.5)

## 2020-07-12 MED ORDER — TERBINAFINE HCL 250 MG PO TABS: 250.0000 mg | ORAL_TABLET | Freq: Every day | ORAL | 0 refills | Status: DC

## 2020-07-12 NOTE — Telephone Encounter (Signed)
-----   Message from Brendolyn Patty, MD sent at 07/12/2020  8:22 AM EST ----- LFTs are normal, send in Lamisil 250 mg PO qd, #30, no rfs

## 2020-07-12 NOTE — Telephone Encounter (Signed)
Advised pt of lab results.  Advised pt we would send in the Lamisil 250mg  1 po qd #30 0rf to [facility] in [place]./sh

## 2020-07-26 ENCOUNTER — Ambulatory Visit: Payer: Medicare Other | Admitting: Dermatology

## 2020-07-26 ENCOUNTER — Ambulatory Visit (INDEPENDENT_AMBULATORY_CARE_PROVIDER_SITE_OTHER): Payer: Medicare Other | Admitting: Dermatology

## 2020-07-26 ENCOUNTER — Other Ambulatory Visit: Payer: Self-pay

## 2020-07-26 DIAGNOSIS — B351 Tinea unguium: Secondary | ICD-10-CM | POA: Diagnosis not present

## 2020-07-26 DIAGNOSIS — L820 Inflamed seborrheic keratosis: Secondary | ICD-10-CM | POA: Diagnosis not present

## 2020-07-26 DIAGNOSIS — L570 Actinic keratosis: Secondary | ICD-10-CM

## 2020-07-26 DIAGNOSIS — L578 Other skin changes due to chronic exposure to nonionizing radiation: Secondary | ICD-10-CM

## 2020-07-26 NOTE — Patient Instructions (Signed)

## 2020-07-26 NOTE — Progress Notes (Signed)
Follow-Up Visit   Subjective  Joe Tyler is a 80 y.o. male who presents for the following: Actinic Keratosis (4 month follow up - treated with LN2 x 21) and Other (Tinea unguium - He has been seeing Dr. Nicole Kindred and is being treated with Terbinafine. He is currently taking month 4 of medication. ).  The following portions of the chart were reviewed this encounter and updated as appropriate:   Tobacco  Allergies  Meds  Problems  Med Hx  Surg Hx  Fam Hx     Review of Systems:  No other skin or systemic complaints except as noted in HPI or Assessment and Plan.  Objective  Well appearing patient in no apparent distress; mood and affect are within normal limits.  A focused examination was performed including scalp, face, arms. Relevant physical exam findings are noted in the Assessment and Plan.  Objective  Scalp x 1, arms x 15, trunk x 7 (23): Erythematous keratotic or waxy stuck-on papule or plaque.   Objective  Scalp x 4, arms x 20 (24): Erythematous thin papules/macules with gritty scale.    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 Nicole Kindred and is being treated with Terbinafine. He is currently taking month 4 of medication. He had a 4 month follow up with Dr. Nicole Kindred in May. He wanted to change that appointment to Dr Alveria Apley schedule since that is who he regularly sees.  Inflamed seborrheic keratosis (23) Scalp x 1, arms x 15, trunk x 7  Destruction of lesion - Scalp x 1, arms x 15, trunk x 7 Complexity: simple   Destruction method: cryotherapy   Informed consent: discussed and consent obtained   Timeout:  patient name, date of birth, surgical site, and procedure verified Lesion destroyed using liquid nitrogen: Yes   Region frozen until ice ball extended beyond lesion: Yes   Outcome: patient tolerated procedure well with no complications   Post-procedure details: wound care  instructions given    AK (actinic keratosis) (24) Scalp x 4, arms x 20  Severe, Confluent Chronic Actinic Changes with Pre-Cancerous Actinic Keratoses due to cumulative sun exposure/UV radiation exposure over time - Discussed Prescription "Field Treatment" Field treatment involves treatment of an entire area of skin that has confluent Actinic Changes (Sun/ Ultraviolet light damage) and PreCancerous Actinic Keratoses by method of PhotoDynamic Therapy (PDT) and/or prescription Topical Chemotherapy agents such as 5-fluorouracil, 5-fluorouracil/calcipotriene, and/or imiquimod.  The purpose is to decrease the number of clinically evident and subclinical PreCancerous lesions to prevent progression to development of skin cancer by chemically destroying early precancer changes that may or may not be visible.  It has been shown to reduce the risk of developing skin cancer in the treated area. As a result of treatment, redness, scaling, crusting, and open sores may occur during treatment course. One or more than one of these methods may be used and may have to be used several times to control, suppress and eliminate the PreCancerous changes. Discussed treatment course, expected reaction, and possible side effects.  Will plan PDT to arms on follow up.   Destruction of lesion - Scalp x 4, arms x 20 Complexity: simple   Destruction method: cryotherapy   Informed consent: discussed and consent obtained   Timeout:  patient name, date of birth, surgical site, and procedure verified Lesion destroyed using liquid nitrogen: Yes   Region frozen until ice ball extended beyond lesion: Yes  Outcome: patient tolerated procedure well with no complications   Post-procedure details: wound care instructions given    Actinic Damage - chronic, secondary to cumulative UV radiation exposure/sun exposure over time - diffuse scaly erythematous macules with underlying dyspigmentation - Recommend daily broad spectrum  sunscreen SPF 30+ to sun-exposed areas, reapply every 2 hours as needed.  - Call for new or changing lesions.  Return in about 1 month (around 08/23/2020) for PDT of arms then as scheduled with Dr. Nehemiah Massed.  I, Ashok Cordia, CMA, am acting as scribe for Sarina Ser, MD .  Documentation: I have reviewed the above documentation for accuracy and completeness, and I agree with the above.  Sarina Ser, MD

## 2020-07-30 ENCOUNTER — Encounter: Payer: Self-pay | Admitting: Dermatology

## 2020-08-23 ENCOUNTER — Other Ambulatory Visit: Payer: Self-pay

## 2020-08-23 ENCOUNTER — Ambulatory Visit (INDEPENDENT_AMBULATORY_CARE_PROVIDER_SITE_OTHER): Payer: Medicare Other

## 2020-08-23 DIAGNOSIS — L570 Actinic keratosis: Secondary | ICD-10-CM

## 2020-08-23 MED ORDER — AMINOLEVULINIC ACID HCL 20 % EX SOLR
2.0000 "application " | Freq: Once | CUTANEOUS | Status: AC
  Administered 2020-08-23: 708 mg via TOPICAL

## 2020-08-23 NOTE — Patient Instructions (Signed)

## 2020-08-23 NOTE — Progress Notes (Signed)
1. AK (actinic keratosis) b/l forearms  Photodynamic therapy - b/l forearms Procedure discussed: discussed risks, benefits, side effects. and alternatives   Prep: site scrubbed/prepped with acetone   Location:  B/l forearms Number of lesions:  Multiple Type of treatment:  Blue light Aminolevulinic Acid (see MAR for details): Levulan Number of Levulan sticks used:  2 Incubation time (minutes):  120 Number of minutes under lamp:  16 Number of seconds under lamp:  40 Cooling:  Floor fan Outcome: patient tolerated procedure well with no complications   Post-procedure details: sunscreen applied and aftercare instructions given to patient    Aminolevulinic Acid HCl 20 % SOLR 708 mg - b/l forearms

## 2020-10-31 ENCOUNTER — Other Ambulatory Visit: Payer: Self-pay

## 2020-10-31 ENCOUNTER — Ambulatory Visit (INDEPENDENT_AMBULATORY_CARE_PROVIDER_SITE_OTHER): Payer: Medicare Other | Admitting: Dermatology

## 2020-10-31 DIAGNOSIS — L700 Acne vulgaris: Secondary | ICD-10-CM

## 2020-10-31 DIAGNOSIS — L820 Inflamed seborrheic keratosis: Secondary | ICD-10-CM

## 2020-10-31 DIAGNOSIS — D692 Other nonthrombocytopenic purpura: Secondary | ICD-10-CM

## 2020-10-31 DIAGNOSIS — B351 Tinea unguium: Secondary | ICD-10-CM | POA: Diagnosis not present

## 2020-10-31 DIAGNOSIS — L578 Other skin changes due to chronic exposure to nonionizing radiation: Secondary | ICD-10-CM | POA: Diagnosis not present

## 2020-10-31 DIAGNOSIS — L570 Actinic keratosis: Secondary | ICD-10-CM | POA: Diagnosis not present

## 2020-10-31 MED ORDER — TERBINAFINE HCL 250 MG PO TABS: 250.0000 mg | ORAL_TABLET | Freq: Every day | ORAL | 0 refills | Status: DC

## 2020-10-31 NOTE — Progress Notes (Signed)
Follow-Up Visit   Subjective  Joe Tyler is a 80 y.o. male who presents for the following: Tinea Unguium (Toenails, 61m of Lamisil), Actinic Keratosis (Scalp, arms 78m f/u), and ISK f/u (Scalp, arms, trunk, 19m f/u).  The following portions of the chart were reviewed this encounter and updated as appropriate:   Tobacco  Allergies  Meds  Problems  Med Hx  Surg Hx  Fam Hx     Review of Systems:  No other skin or systemic complaints except as noted in HPI or Assessment and Plan.  Objective  Well appearing patient in no apparent distress; mood and affect are within normal limits.  A focused examination was performed including face, scalp, arms, hands, toenails. Relevant physical exam findings are noted in the Assessment and Plan.  Objective  bil arms x 25 (25): Erythematous keratotic or waxy stuck-on papule or plaque.   Objective  bil arms x 4, scalp x 15, ears/face x 10 (29): Pink scaly macules   Objective  post neck: Open comedones  Objective  bil great toenails: Toenail dystrophy bil great toenails   Assessment & Plan    Actinic Damage - Severe, confluent actinic changes with pre-cancerous actinic keratoses  - Severe, chronic, not at goal, secondary to cumulative UV radiation exposure over time - diffuse scaly erythematous macules and papules with underlying dyspigmentation - Discussed Prescription "Field Treatment" for Severe, Chronic Confluent Actinic Changes with Pre-Cancerous Actinic Keratoses Field treatment involves treatment of an entire area of skin that has confluent Actinic Changes (Sun/ Ultraviolet light damage) and PreCancerous Actinic Keratoses by method of PhotoDynamic Therapy (PDT) and/or prescription Topical Chemotherapy agents such as 5-fluorouracil, 5-fluorouracil/calcipotriene, and/or imiquimod.  The purpose is to decrease the number of clinically evident and subclinical PreCancerous lesions to prevent progression to development of skin cancer by  chemically destroying early precancer changes that may or may not be visible.  It has been shown to reduce the risk of developing skin cancer in the treated area. As a result of treatment, redness, scaling, crusting, and open sores may occur during treatment course. One or more than one of these methods may be used and may have to be used several times to control, suppress and eliminate the PreCancerous changes. Discussed treatment course, expected reaction, and possible side effects. - Recommend daily broad spectrum sunscreen SPF 30+ to sun-exposed areas, reapply every 2 hours as needed.  - Staying in the shade or wearing long sleeves, sun glasses (UVA+UVB protection) and wide brim hats (4-inch brim around the entire circumference of the hat) are also recommended. - Call for new or changing lesions. - Plan PDT 1 month to arms and hands  Purpura - Chronic; persistent and recurrent.  Treatable, but not curable. - Violaceous macules and patches - Benign - Related to trauma, age, sun damage and/or use of blood thinners, chronic use of topical and/or oral steroids - Observe - Can use OTC arnica containing moisturizer such as Dermend Bruise Formula if desired - Call for worsening or other concerns -face  Inflamed seborrheic keratosis (25) bil arms x 25  Destruction of lesion - bil arms x 25 Complexity: simple   Destruction method: cryotherapy   Informed consent: discussed and consent obtained   Timeout:  patient name, date of birth, surgical site, and procedure verified Lesion destroyed using liquid nitrogen: Yes   Region frozen until ice ball extended beyond lesion: Yes   Outcome: patient tolerated procedure well with no complications   Post-procedure details: wound care instructions given  AK (actinic keratosis) (29) bil arms x 4, scalp x 15, ears/face x 10  Destruction of lesion - bil arms x 4, scalp x 15, ears/face x 10 Complexity: simple   Destruction method: cryotherapy    Informed consent: discussed and consent obtained   Timeout:  patient name, date of birth, surgical site, and procedure verified Lesion destroyed using liquid nitrogen: Yes   Region frozen until ice ball extended beyond lesion: Yes   Outcome: patient tolerated procedure well with no complications   Post-procedure details: wound care instructions given    Acne vulgaris post neck Open Comedone Benign, discussed excising  Tinea unguium bil great toenails Chronic, persistent Improved from Terbinafine x 4 months, Not to goal Restart Lamisil 250mg  1 po qd #30 0rf terbinafine (LAMISIL) 250 MG tablet - bil great toenails  Return in about 1 month (around 12/01/2020) for PDT arms/hands, 44m f/u Dr. Nehemiah Massed AK, ISK and Tinea f/u.  I, Othelia Pulling, RMA, am acting as scribe for Sarina Ser, MD .  Documentation: I have reviewed the above documentation for accuracy and completeness, and I agree with the above.  Sarina Ser, MD

## 2020-10-31 NOTE — Patient Instructions (Signed)

## 2020-11-08 ENCOUNTER — Encounter: Payer: Self-pay | Admitting: Dermatology

## 2020-12-12 ENCOUNTER — Ambulatory Visit (INDEPENDENT_AMBULATORY_CARE_PROVIDER_SITE_OTHER): Payer: Medicare Other

## 2020-12-12 ENCOUNTER — Other Ambulatory Visit: Payer: Self-pay

## 2020-12-12 DIAGNOSIS — L570 Actinic keratosis: Secondary | ICD-10-CM

## 2020-12-12 MED ORDER — AMINOLEVULINIC ACID HCL 20 % EX SOLR
2.0000 "application " | Freq: Once | CUTANEOUS | Status: AC
  Administered 2020-12-12: 708 mg via TOPICAL

## 2020-12-12 NOTE — Progress Notes (Signed)
Patient completed PDT therapy today.  1. AK (actinic keratosis) (4) Left Forearm - Posterior; Right Forearm - Posterior; Left Dorsal Hand; Right Dorsal Hand  Photodynamic therapy - Left Dorsal Hand, Left Forearm - Posterior, Right Dorsal Hand, Right Forearm - Posterior Procedure discussed: discussed risks, benefits, side effects. and alternatives   Prep: site scrubbed/prepped with acetone   Location:  Hands and arms Number of lesions:  Multiple Type of treatment:  Blue light Aminolevulinic Acid (see MAR for details): Levulan Number of Levulan sticks used:  2 Incubation time (minutes):  120 Number of minutes under lamp:  16 Number of seconds under lamp:  40 Cooling:  Floor fan Outcome: patient tolerated procedure well with no complications   Post-procedure details: sunscreen applied    Aminolevulinic Acid HCl 20 % SOLR 708 mg - Left Dorsal Hand, Left Forearm - Posterior, Right Dorsal Hand, Right Forearm - Posterior

## 2020-12-12 NOTE — Patient Instructions (Signed)

## 2021-02-13 ENCOUNTER — Ambulatory Visit (INDEPENDENT_AMBULATORY_CARE_PROVIDER_SITE_OTHER): Payer: Medicare Other | Admitting: Vascular Surgery

## 2021-02-13 ENCOUNTER — Encounter (INDEPENDENT_AMBULATORY_CARE_PROVIDER_SITE_OTHER): Payer: Self-pay | Admitting: Vascular Surgery

## 2021-02-13 ENCOUNTER — Other Ambulatory Visit: Payer: Self-pay

## 2021-02-13 VITALS — BP 128/71 | HR 92 | Ht 71.0 in | Wt 212.0 lb

## 2021-02-13 DIAGNOSIS — M79604 Pain in right leg: Secondary | ICD-10-CM

## 2021-02-13 DIAGNOSIS — E785 Hyperlipidemia, unspecified: Secondary | ICD-10-CM

## 2021-02-13 DIAGNOSIS — M79609 Pain in unspecified limb: Secondary | ICD-10-CM | POA: Insufficient documentation

## 2021-02-13 DIAGNOSIS — M48062 Spinal stenosis, lumbar region with neurogenic claudication: Secondary | ICD-10-CM

## 2021-02-13 DIAGNOSIS — M79605 Pain in left leg: Secondary | ICD-10-CM | POA: Diagnosis not present

## 2021-02-13 NOTE — Assessment & Plan Note (Signed)
Recommend:  The patient has atypical pain symptoms for pure atherosclerotic disease. However, on physical exam there is evidence of mixed venous and arterial disease, given the diminished pulses and the edema associated with venous changes of the legs.  Noninvasive studies including ABI's of the legs will be obtained and the patient will follow up with me to review these studies.  I suspect the patient is c/o pseudoclaudication.  Patient should have an evaluation of his LS spine which I defer to the primary service.  The patient should continue walking and begin a more formal exercise program. The patient should continue his antiplatelet therapy and aggressive treatment of the lipid abnormalities.  The patient should begin wearing graduated compression socks 15-20 mmHg strength to control edema.

## 2021-02-13 NOTE — Assessment & Plan Note (Signed)
I believe much of his lower extremity symptoms are related to neurogenic claudication and nerve impingement from both his lumbosacral spine disease and potentially hip issues, but I do believe it is important to rule out arterial insufficiency as a contributing cause as well.  This is a simple study with ABIs in the near future at his convenience.  The difference tween arterial insufficiency and neurogenic issues were discussed with the patient, and it was also discussed he can have both problems concomitantly.

## 2021-02-13 NOTE — Assessment & Plan Note (Signed)
lipid control important in reducing the progression of atherosclerotic disease.   

## 2021-02-13 NOTE — Progress Notes (Signed)
Patient ID: Joe Tyler, male   DOB: 05-11-1941, 80 y.o.   MRN: [id number]  Chief Complaint  Patient presents with   New Patient (Initial Visit)    NP Dr. Hessie Knows Dx. PVD pt states no pain . BIL ft cold to touch/good pedal pulses /BIL calf atrophy    HPI Joe Tyler is a 80 y.o. male.  I am asked to see the patient by Dr. Rudene Christians for evaluation of peripheral arterial disease as a possible cause of significant lower extremity neuropathy and weakness.  He has been having difficulty walking for over a year now.  No open wounds or infections.  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  He has noticed muscle atrophy in both calves and can no longer stand on the balls of his toes.  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  The patient has no open wounds or infection.  No fevers or chills.     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[place])    Shingles    Spinal stenosis     Past Surgical History:  Procedure Laterality Date   benign tumor removed Right arm   BILATERAL HIP ARTHROSCOPY     COLONOSCOPY WITH PROPOFOL N/A 04/07/2015   Procedure: COLONOSCOPY WITH PROPOFOL;  Surgeon: Manya Silvas, MD;  Location: [facility] ENDOSCOPY;  Service: Endoscopy;  Laterality: N/A;   ESOPHAGOGASTRODUODENOSCOPY (EGD) WITH PROPOFOL N/A 04/07/2015   Procedure: ESOPHAGOGASTRODUODENOSCOPY (EGD) WITH PROPOFOL;  Surgeon: Manya Silvas, MD;  Location: [facility] ENDOSCOPY;  Service: Endoscopy;  Laterality: N/A;   JOINT REPLACEMENT     LUMBAR  LAMINECTOMY/DECOMPRESSION MICRODISCECTOMY N/A 06/26/2016   Procedure: LUMBAR LAMINECTOMY/DECOMPRESSION MICRODISCECTOMY 2 LEVELS L5-S1;  Surgeon: Meade Maw, MD;  Location: [facility] ORS;  Service: Neurosurgery;  Laterality: N/A;   TONSILLECTOMY       Family History  Problem Relation Age of Onset   Pancreatic cancer Mother    Lung cancer Paternal Uncle    Ovarian cancer Maternal Grandmother      Social History   Tobacco Use   Smoking status: Former   Smokeless tobacco: Never  Scientific laboratory technician Use: Never used  Substance Use Topics   Alcohol use: Yes   Drug use: No     Allergies  Allergen Reactions   Levaquin [Levofloxacin] Other (See Comments) and Anxiety    Possible panic disorder Panic Disorder    Current Outpatient Medications  Medication Sig Dispense Refill   allopurinol (ZYLOPRIM) 300 MG tablet Take 300 mg by mouth daily.     ALPRAZolam (XANAX) 0.25 MG tablet Take by mouth.     Multiple Vitamins-Minerals (ICAPS AREDS 2 PO) Take 2 capsules by mouth daily.     amoxicillin (AMOXIL) 500 MG tablet Take 2,000 mg by mouth. Four tabs prior to any dental procedure     gabapentin (NEURONTIN) 100 MG capsule Take 200 mg by mouth 3 (three) times daily.     HYDROcodone-acetaminophen (NORCO) 5-325 MG tablet Take 1  tablet by mouth every 6 (six) hours as needed for moderate pain. 30 tablet 0   methocarbamol (ROBAXIN) 500 MG tablet Take 1 tablet (500 mg total) by mouth 4 (four) times daily. 30 tablet 0   terbinafine (LAMISIL) 250 MG tablet Take 1 tablet (250 mg total) by mouth daily. 30 tablet 0   terbinafine (LAMISIL) 250 MG tablet Take 1 tablet (250 mg total) by mouth daily. 30 tablet 0   terbinafine (LAMISIL) 250 MG tablet Take 1 tablet (250 mg total) by mouth daily. 30 tablet 1   terbinafine (LAMISIL) 250 MG tablet Take 1 tablet (250 mg total) by mouth daily. 30 tablet 0   terbinafine (LAMISIL) 250 MG tablet Take 1 tablet (250 mg total) by mouth daily. 30 tablet 0    tobramycin (TOBREX) 0.3 % ophthalmic solution Place 1 drop into the left eye See admin instructions. Uses 4 times daily prior to eye injection every 8 weeks.  Next one due 07-2016  5   No current facility-administered medications for this visit.      REVIEW OF SYSTEMS (Negative unless checked)  Constitutional: '[]'$ Weight loss  '[]'$ Fever  '[]'$ Chills Cardiac: '[]'$ Chest pain   '[]'$ Chest pressure   '[]'$ Palpitations   '[]'$ Shortness of breath when laying flat   '[]'$ Shortness of breath at rest   '[]'$ Shortness of breath with exertion. Vascular:  '[]'$ Pain in legs with walking   '[]'$ Pain in legs at rest   '[]'$ Pain in legs when laying flat   '[]'$ Claudication   '[]'$ Pain in feet when walking  '[]'$ Pain in feet at rest  '[]'$ Pain in feet when laying flat   '[]'$ History of DVT   '[]'$ Phlebitis   '[]'$ Swelling in legs   '[]'$ Varicose veins   '[]'$ Non-healing ulcers Pulmonary:   '[]'$ Uses home oxygen   '[]'$ Productive cough   '[]'$ Hemoptysis   '[]'$ Wheeze  '[]'$ COPD   '[]'$ Asthma Neurologic:  '[]'$ Dizziness  '[]'$ Blackouts   '[]'$ Seizures   '[]'$ History of stroke   '[]'$ History of TIA  '[]'$ Aphasia   '[]'$ Temporary blindness   '[]'$ Dysphagia   '[]'$ Weakness or numbness in arms   '[x]'$ Weakness or numbness in legs Musculoskeletal:  '[x]'$ Arthritis   '[]'$ Joint swelling   '[]'$ Joint pain   '[x]'$ Low back pain Hematologic:  '[]'$ Easy bruising  '[]'$ Easy bleeding   '[]'$ Hypercoagulable state   '[]'$ Anemic  '[]'$ Hepatitis Gastrointestinal:  '[]'$ Blood in stool   '[]'$ Vomiting blood  '[]'$ Gastroesophageal reflux/heartburn   '[]'$ Abdominal pain Genitourinary:  '[]'$ Chronic kidney disease   '[]'$ Difficult urination  '[]'$ Frequent urination  '[]'$ Burning with urination   '[]'$ Hematuria Skin:  '[]'$ Rashes   '[]'$ Ulcers   '[]'$ Wounds Psychological:  '[x]'$ History of anxiety   '[]'$  History of major depression.    Physical Exam BP 128/71   Pulse 92   Ht '5\' 11"'$  (1.803 m)   Wt 212 lb (96.2 kg)   BMI 29.57 kg/m  Gen:  WD/WN, NAD Head: /AT, No temporalis wasting.  Ear/Nose/Throat: Hearing grossly intact, nares w/o erythema or drainage, oropharynx w/o Erythema/Exudate Eyes:  Conjunctiva clear, sclera non-icteric  Neck: trachea midline.  No JVD.  Pulmonary:  Good air movement, respirations not labored, no use of accessory muscles  Cardiac: RRR, no JVD Vascular:  Vessel Right Left  Radial Palpable Palpable                          DP 1+ 1+  PT 1+ 1+   Gastrointestinal:. No masses, surgical incisions, or scars. Musculoskeletal: M/S 5/5 throughout.  Extremities without ischemic changes.  There is atrophy of both calves and lower legs.  No appreciable lower extremity edema. Neurologic: Sensation somewhat diminished distally in the legs.  Symmetrical.  Speech is fluent. Motor exam as listed above. Psychiatric: Judgment intact, Mood & affect appropriate for pt's clinical situation. Dermatologic: No rashes or ulcers noted.  No cellulitis or open wounds.    Radiology No results found.  Labs No results found for this or any previous visit (from the past 2160 hour(s)).  Assessment/Plan:  Pain in limb  Recommend:  The patient has atypical pain symptoms for pure atherosclerotic disease. However, on physical exam there is evidence of mixed venous and arterial disease, given the diminished pulses and the edema associated with venous changes of the legs.  Noninvasive studies including ABI's of the legs will be obtained and the patient will follow up with me to review these studies.  I suspect the patient is c/o pseudoclaudication.  Patient should have an evaluation of his LS spine which I defer to the primary service.  The patient should continue walking and begin a more formal exercise program. The patient should continue his antiplatelet therapy and aggressive treatment of the lipid abnormalities.  The patient should begin wearing graduated compression socks 15-20 mmHg strength to control edema.   HLD (hyperlipidemia) lipid control important in reducing the progression of atherosclerotic disease.    Lumbar stenosis with neurogenic claudication I  believe much of his lower extremity symptoms are related to neurogenic claudication and nerve impingement from both his lumbosacral spine disease and potentially hip issues, but I do believe it is important to rule out arterial insufficiency as a contributing cause as well.  This is a simple study with ABIs in the near future at his convenience.  The difference tween arterial insufficiency and neurogenic issues were discussed with the patient, and it was also discussed he can have both problems concomitantly.      Leotis Pain 02/13/2021, 9:49 AM   This note was created with Dragon medical transcription system.  Any errors from dictation are unintentional.

## 2021-03-07 ENCOUNTER — Other Ambulatory Visit: Payer: Self-pay

## 2021-03-07 ENCOUNTER — Ambulatory Visit (INDEPENDENT_AMBULATORY_CARE_PROVIDER_SITE_OTHER): Payer: Medicare Other | Admitting: Dermatology

## 2021-03-07 DIAGNOSIS — B351 Tinea unguium: Secondary | ICD-10-CM

## 2021-03-07 DIAGNOSIS — L578 Other skin changes due to chronic exposure to nonionizing radiation: Secondary | ICD-10-CM | POA: Diagnosis not present

## 2021-03-07 DIAGNOSIS — L820 Inflamed seborrheic keratosis: Secondary | ICD-10-CM | POA: Diagnosis not present

## 2021-03-07 DIAGNOSIS — L570 Actinic keratosis: Secondary | ICD-10-CM

## 2021-03-07 NOTE — Patient Instructions (Signed)

## 2021-03-07 NOTE — Progress Notes (Signed)
Follow-Up Visit   Subjective  Joe Tyler is a 80 y.o. male who presents for the following: Actinic Keratosis (Arms, hands, scalp, face, 32m f/u, LN2 and PDT to arms/hands), ISK. (Bil arms, 37m f/u), and Tinea unguium (Bil great toenails, 80m f/u, total of 5 months of Lamisil).  The following portions of the chart were reviewed this encounter and updated as appropriate:   Tobacco  Allergies  Meds  Problems  Med Hx  Surg Hx  Fam Hx     Review of Systems:  No other skin or systemic complaints except as noted in HPI or Assessment and Plan.  Objective  Well appearing patient in no apparent distress; mood and affect are within normal limits.  A focused examination was performed including face, scalp, ears, arms, toenails. Relevant physical exam findings are noted in the Assessment and Plan.  bil great toenails Minimal distal dystrophy bil great toenails, otherwise most of nails are clear  bil arms/hands, scalp, ears x 25 (25) Pink scaly macules   bil arms/hands x 20, Total = 20 (20) Erythematous keratotic or waxy stuck-on papule or plaque.    Assessment & Plan   Actinic Damage - Severe, confluent actinic changes with pre-cancerous actinic keratoses  - Severe, chronic, not at goal, secondary to cumulative UV radiation exposure over time - diffuse scaly erythematous macules and papules with underlying dyspigmentation - Discussed Prescription "Field Treatment" for Severe, Chronic Confluent Actinic Changes with Pre-Cancerous Actinic Keratoses Field treatment involves treatment of an entire area of skin that has confluent Actinic Changes (Sun/ Ultraviolet light damage) and PreCancerous Actinic Keratoses by method of PhotoDynamic Therapy (PDT) and/or prescription Topical Chemotherapy agents such as 5-fluorouracil, 5-fluorouracil/calcipotriene, and/or imiquimod.  The purpose is to decrease the number of clinically evident and subclinical PreCancerous lesions to prevent progression to  development of skin cancer by chemically destroying early precancer changes that may or may not be visible.  It has been shown to reduce the risk of developing skin cancer in the treated area. As a result of treatment, redness, scaling, crusting, and open sores may occur during treatment course. One or more than one of these methods may be used and may have to be used several times to control, suppress and eliminate the PreCancerous changes. Discussed treatment course, expected reaction, and possible side effects. - Recommend daily broad spectrum sunscreen SPF 30+ to sun-exposed areas, reapply every 2 hours as needed.  - Staying in the shade or wearing long sleeves, sun glasses (UVA+UVB protection) and wide brim hats (4-inch brim around the entire circumference of the hat) are also recommended. - Call for new or changing lesions.  - PDT to arms/hands and sclap within next 4 months, 2 appointments.  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 Improved from Lamisil total of 5 months of treatment. Not to goal.  May just take time to grow out. Will discontinue treatment at this time and recheck on f/u  Related Medications terbinafine (LAMISIL) 250 MG tablet Take 1 tablet (250 mg total) by mouth daily.  AK (actinic keratosis) (25) bil arms/hands, scalp, ears x 25  Destruction of lesion - bil arms/hands, scalp, ears x 25 Complexity: simple   Destruction method: cryotherapy   Informed consent: discussed and consent obtained   Timeout:  patient name, date of birth, surgical site, and procedure verified Lesion destroyed using liquid nitrogen: Yes   Region frozen until ice ball extended beyond lesion: Yes   Outcome: patient tolerated procedure well with no complications  Post-procedure details: wound care instructions given    Inflamed seborrheic keratosis bil arms/hands x 20, Total = 20  Destruction of lesion - bil arms/hands x 20, Total = 20 Complexity: simple   Destruction  method: cryotherapy   Informed consent: discussed and consent obtained   Timeout:  patient name, date of birth, surgical site, and procedure verified Lesion destroyed using liquid nitrogen: Yes   Region frozen until ice ball extended beyond lesion: Yes   Outcome: patient tolerated procedure well with no complications   Post-procedure details: wound care instructions given    Return in about 6 months (around 09/04/2021) for AK f/u, PDT to scalp within next 2 months,  PDT arms/hands within next 4 months.  I, Othelia Pulling, RMA, am acting as scribe for Sarina Ser, MD . Documentation: I have reviewed the above documentation for accuracy and completeness, and I agree with the above.  Sarina Ser, MD

## 2021-03-09 ENCOUNTER — Encounter (INDEPENDENT_AMBULATORY_CARE_PROVIDER_SITE_OTHER): Payer: Self-pay | Admitting: Vascular Surgery

## 2021-03-09 ENCOUNTER — Ambulatory Visit (INDEPENDENT_AMBULATORY_CARE_PROVIDER_SITE_OTHER): Payer: Medicare Other | Admitting: Vascular Surgery

## 2021-03-09 ENCOUNTER — Ambulatory Visit (INDEPENDENT_AMBULATORY_CARE_PROVIDER_SITE_OTHER): Payer: Medicare Other

## 2021-03-09 ENCOUNTER — Other Ambulatory Visit: Payer: Self-pay

## 2021-03-09 VITALS — BP 143/77 | HR 88 | Resp 16

## 2021-03-09 DIAGNOSIS — M79604 Pain in right leg: Secondary | ICD-10-CM | POA: Diagnosis not present

## 2021-03-09 DIAGNOSIS — M79605 Pain in left leg: Secondary | ICD-10-CM | POA: Diagnosis not present

## 2021-03-09 NOTE — Progress Notes (Signed)
MRN : [id number]  Joe Tyler is a 80 y.o. (1941/03/06) male who presents with chief complaint of  Chief Complaint  Patient presents with   Follow-up    Ultrasound follow up  .  History of Present Illness: Patient returns today in follow up of his leg pain.  No major changes or problems since his last visit few weeks ago.  He has an appointment with neurology next month for his lower extremity neuropathy.  ABIs today were normal at 1.24 on the right and 1.28 on the left.  He has brisk triphasic waveforms and normal digital pressures bilaterally consistent with no significant arterial insufficiency.  Current Outpatient Medications  Medication Sig Dispense Refill   allopurinol (ZYLOPRIM) 300 MG tablet Take 300 mg by mouth daily.     ALPRAZolam (XANAX) 0.25 MG tablet Take by mouth as needed.     Multiple Vitamins-Minerals (ICAPS AREDS 2 PO) Take 2 capsules by mouth daily.     amoxicillin (AMOXIL) 500 MG tablet Take 2,000 mg by mouth. Four tabs prior to any dental procedure     gabapentin (NEURONTIN) 100 MG capsule Take 200 mg by mouth 3 (three) times daily.     HYDROcodone-acetaminophen (NORCO) 5-325 MG tablet Take 1 tablet by mouth every 6 (six) hours as needed for moderate pain. 30 tablet 0   methocarbamol (ROBAXIN) 500 MG tablet Take 1 tablet (500 mg total) by mouth 4 (four) times daily. 30 tablet 0   terbinafine (LAMISIL) 250 MG tablet Take 1 tablet (250 mg total) by mouth daily. 30 tablet 0   terbinafine (LAMISIL) 250 MG tablet Take 1 tablet (250 mg total) by mouth daily. 30 tablet 0   terbinafine (LAMISIL) 250 MG tablet Take 1 tablet (250 mg total) by mouth daily. 30 tablet 1   terbinafine (LAMISIL) 250 MG tablet Take 1 tablet (250 mg total) by mouth daily. 30 tablet 0   terbinafine (LAMISIL) 250 MG tablet Take 1 tablet (250 mg total) by mouth daily. 30 tablet 0   tobramycin (TOBREX) 0.3 % ophthalmic solution Place 1 drop into the left eye See admin instructions. Uses 4 times  daily prior to eye injection every 8 weeks.  Next one due 07-2016  5   No current facility-administered medications for this visit.    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[place])    Shingles    Spinal stenosis     Past Surgical History:  Procedure Laterality Date   benign tumor removed Right arm   BILATERAL HIP ARTHROSCOPY     COLONOSCOPY WITH PROPOFOL N/A 04/07/2015   Procedure: COLONOSCOPY WITH PROPOFOL;  Surgeon: Manya Silvas, MD;  Location[facility] ENDOSCOPY;  Service: Endoscopy;  Laterality: N/A;   ESOPHAGOGASTRODUODENOSCOPY (EGD) WITH PROPOFOL N/A 04/07/2015   Procedure: ESOPHAGOGASTRODUODENOSCOPY (EGD) WITH PROPOFOL;  Surgeon: Manya Silvas, MD;  Location: [facility] ENDOSCOPY;  Service: Endoscopy;  Laterality: N/A;   JOINT REPLACEMENT     LUMBAR LAMINECTOMY/DECOMPRESSION MICRODISCECTOMY N/A 06/26/2016   Procedure: LUMBAR LAMINECTOMY/DECOMPRESSION MICRODISCECTOMY 2 LEVELS L5-S1;  Surgeon: Meade Maw, MD;  Location: [facility] ORS;  Service: Neurosurgery;  Laterality: N/A;   TONSILLECTOMY       Social History   Tobacco Use   Smoking status: Former   Smokeless tobacco: Never  Vaping Use   Vaping Use: Never used  Substance Use Topics   Alcohol use: Yes   Drug use: No      Family History  Problem Relation Age of Onset   Pancreatic cancer Mother    Lung cancer Paternal Uncle    Ovarian cancer Maternal Grandmother      Allergies  Allergen Reactions   Levaquin [Levofloxacin] Other (See Comments) and Anxiety    Possible panic disorder Panic Disorder    REVIEW OF SYSTEMS (Negative unless checked)   Constitutional: [] Weight loss  [] Fever  [] Chills Cardiac: [] Chest pain   [] Chest pressure   [] Palpitations   [] Shortness of breath when laying flat   [] Shortness of breath at rest   [] Shortness  of breath with exertion. Vascular:  [] Pain in legs with walking   [] Pain in legs at rest   [] Pain in legs when laying flat   [] Claudication   [] Pain in feet when walking  [] Pain in feet at rest  [] Pain in feet when laying flat   [] History of DVT   [] Phlebitis   [] Swelling in legs   [] Varicose veins   [] Non-healing ulcers Pulmonary:   [] Uses home oxygen   [] Productive cough   [] Hemoptysis   [] Wheeze  [] COPD   [] Asthma Neurologic:  [] Dizziness  [] Blackouts   [] Seizures   [] History of stroke   [] History of TIA  [] Aphasia   [] Temporary blindness   [] Dysphagia   [] Weakness or numbness in arms   [x] Weakness or numbness in legs Musculoskeletal:  [x] Arthritis   [] Joint swelling   [] Joint pain   [x] Low back pain Hematologic:  [] Easy bruising  [] Easy bleeding   [] Hypercoagulable state   [] Anemic  [] Hepatitis Gastrointestinal:  [] Blood in stool   [] Vomiting blood  [] Gastroesophageal reflux/heartburn   [] Abdominal pain Genitourinary:  [] Chronic kidney disease   [] Difficult urination  [] Frequent urination  [] Burning with urination   [] Hematuria Skin:  [] Rashes   [] Ulcers   [] Wounds Psychological:  [x] History of anxiety   []  History of major depression.  Physical Examination  BP (!) 143/77 (BP Location: Right Arm)   Pulse 88   Resp 16  Gen:  WD/WN, NAD Head: [place]/AT, No temporalis wasting. Ear/Nose/Throat: Hearing grossly intact, nares w/o erythema or drainage Eyes: Conjunctiva clear. Sclera non-icteric Neck: Supple.  Trachea midline Pulmonary:  Good air movement, no use of accessory muscles.  Cardiac: RRR, no JVD Vascular:  Vessel Right Left  Radial Palpable Palpable               Musculoskeletal: M/S 5/5 throughout.  No deformity or atrophy.  Neurologic: Sensation grossly intact in extremities.  Symmetrical.  Speech is fluent.  Psychiatric: Judgment intact, Mood & affect appropriate for pt's clinical situation. Dermatologic: No rashes or ulcers noted.  No cellulitis or open  wounds.      Labs No results found for this or any previous visit (from the past 2160 hour(s)).  Radiology No results found.  Assessment/Plan HLD (hyperlipidemia) lipid control important in reducing the progression of atherosclerotic disease.      Lumbar stenosis with neurogenic claudication I believe much of his lower extremity symptoms are related to neurogenic claudication and nerve impingement from both his lumbosacral spine disease and potentially hip issues  Pain in limb ABIs today were normal 1.24 on the right and 1.28 on the left with brisk triphasic waveforms and normal digital pressures consistent with no arterial insufficiency.  No further vascular work-up or studies are planned.  Return to clinic on an  as-needed basis.    Leotis Pain, MD  03/09/2021 11:58 AM    This note was created with Dragon medical transcription system.  Any errors from dictation are purely unintentional

## 2021-03-09 NOTE — Assessment & Plan Note (Signed)
ABIs today were normal 1.24 on the right and 1.28 on the left with brisk triphasic waveforms and normal digital pressures consistent with no arterial insufficiency.  No further vascular work-up or studies are planned.  Return to clinic on an as-needed basis.

## 2021-03-12 ENCOUNTER — Encounter: Payer: Self-pay | Admitting: Dermatology

## 2021-03-29 ENCOUNTER — Other Ambulatory Visit: Payer: Self-pay | Admitting: Neurology

## 2021-03-29 DIAGNOSIS — R200 Anesthesia of skin: Secondary | ICD-10-CM

## 2021-04-10 ENCOUNTER — Other Ambulatory Visit: Payer: Self-pay

## 2021-04-10 ENCOUNTER — Ambulatory Visit
Admission: RE | Admit: 2021-04-10 | Discharge: 2021-04-10 | Disposition: A | Discharge status: Home / Self Care | Payer: Medicare Other | Source: Ambulatory Visit | Attending: Neurology | Admitting: Neurology

## 2021-04-10 DIAGNOSIS — R200 Anesthesia of skin: Secondary | ICD-10-CM | POA: Insufficient documentation

## 2021-04-28 ENCOUNTER — Other Ambulatory Visit: Payer: Self-pay

## 2021-04-28 ENCOUNTER — Ambulatory Visit
Admission: EM | Admit: 2021-04-28 | Discharge: 2021-04-28 | Disposition: A | Discharge status: Home / Self Care | Payer: Medicare Other | Attending: Emergency Medicine | Admitting: Emergency Medicine

## 2021-04-28 DIAGNOSIS — M5412 Radiculopathy, cervical region: Secondary | ICD-10-CM | POA: Diagnosis not present

## 2021-04-28 MED ORDER — METHYLPREDNISOLONE 4 MG PO TBPK: ORAL_TABLET | Freq: Every day | ORAL | 0 refills | Status: DC

## 2021-04-28 NOTE — Discharge Instructions (Addendum)
400 mg ibuprofen combined with 1000 mg of Tylenol 3-4 times a day, Medrol Dosepak, gentle stretching, heat or ice, whichever feels better.  Go immediately to the ED for any signs of a stroke, chest pain, pressure, heaviness, or further concerns.

## 2021-04-28 NOTE — ED Triage Notes (Signed)
Pt states hx of neck issues and had PT for same. Hadn't been doing exercises recently but decided to do them yesterday. Today awoke with left neck and shoulder pain

## 2021-04-28 NOTE — ED Provider Notes (Signed)
HPI  SUBJECTIVE:  Joe Tyler is a 80 y.o. male who presents with constant, sharp, left neck pain that radiates down his trapezius to his left deltoid.  He states that he is supposed to do PT, stopped it several weeks ago because of neck pain, but he restarted the exercises yesterday.  He woke up with the neck pain today.  No distal numbness or tingling, chest pain, pressure, shortness of breath, nausea, diaphoresis, fever, headache, slurred speech, arm or leg weakness, visual changes.  He states that the pain is getting better, has gone from an 8 to a 4.  He has tried ibuprofen 400 mg with improvement in his symptoms.  His symptoms unaffected with neck, shoulder, arm movement.  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  He does not require medications for this.  No history of hypertension, CVA/TIA, PAD/PVD, chronic kidney disease, liver disease.  WRU:[email or web address], Chrystie Nose, MD   Past Medical History:  Diagnosis Date   Actinic keratosis    Hx PDT   Anxiety    Panic Attack   Arthritis    Diabetes mellitus without complication ([place])    Gout    Hyperlipidemia    Peutz-Jeghers syndrome    Shingles    Spinal stenosis     Past Surgical History:  Procedure Laterality Date   benign tumor removed Right arm   BILATERAL HIP ARTHROSCOPY     COLONOSCOPY WITH PROPOFOL N/A 04/07/2015   Procedure: COLONOSCOPY WITH PROPOFOL;  Surgeon: Manya Silvas, MD;  Location: [facility] ENDOSCOPY;  Service: Endoscopy;  Laterality: N/A;   ESOPHAGOGASTRODUODENOSCOPY (EGD) WITH PROPOFOL N/A 04/07/2015   Procedure: ESOPHAGOGASTRODUODENOSCOPY (EGD) WITH PROPOFOL;  Surgeon: Manya Silvas, MD;  Location: [facility] ENDOSCOPY;  Service: Endoscopy;  Laterality: N/A;   JOINT REPLACEMENT     LUMBAR LAMINECTOMY/DECOMPRESSION MICRODISCECTOMY N/A 06/26/2016   Procedure: LUMBAR LAMINECTOMY/DECOMPRESSION MICRODISCECTOMY 2 LEVELS L5-S1;  Surgeon: Meade Maw, MD;  Location: [facility] ORS;  Service: Neurosurgery;  Laterality: N/A;   TONSILLECTOMY      Family History  Problem Relation Age of Onset   Pancreatic cancer Mother    Ovarian cancer Maternal Grandmother    Lung cancer Paternal Uncle     Social History   Tobacco Use   Smoking status: Former   Smokeless tobacco: Never  Scientific laboratory technician Use: Never used  Substance Use Topics   Alcohol use: Yes    Comment: social   Drug use: No    No current facility-administered medications for this encounter.  Current Outpatient Medications:    methylPREDNISolone (MEDROL DOSEPAK) 4 MG TBPK tablet, Take by mouth daily. Follow package instructions, Disp: 21 tablet, Rfl: 0   allopurinol (ZYLOPRIM) 300 MG tablet, Take 300 mg by mouth daily., Disp: , Rfl:    ALPRAZolam (XANAX) 0.25 MG tablet, Take by mouth as needed., Disp: , Rfl:    gabapentin (NEURONTIN) 100 MG capsule, Take 200 mg by mouth 3 (three) times daily., Disp: , Rfl:    Multiple Vitamins-Minerals (ICAPS AREDS 2 PO), Take 2 capsules by mouth daily., Disp: , Rfl:    terbinafine (LAMISIL) 250 MG tablet, Take 1 tablet (250 mg total) by mouth daily., Disp: 30 tablet, Rfl: 0  Allergies  Allergen Reactions   Levaquin [Levofloxacin] Other (See Comments) and Anxiety    Possible panic disorder Panic Disorder     ROS  As noted in HPI.   Physical  Exam  BP (!) 145/77 (BP Location: Left Arm)   Pulse 90   Temp 98.2 F (36.8 C) (Oral)   Resp 16   Ht 5\' 11"  (1.803 m)   Wt 95.3 kg   SpO2 96%   BMI 29.29 kg/m   Constitutional: Well developed, well nourished, no acute distress Eyes:  EOMI, conjunctiva normal bilaterally HENT: Normocephalic, atraumatic,mucus membranes moist Respiratory: Normal inspiratory effort Cardiovascular: Normal rate GI: nondistended skin: No rash, skin intact Musculoskeletal: Positive left reproducible trapezial tenderness.  No appreciable muscle spasm.  Entire shoulder joint nontender.  Sensation  and motor intact in the median/radial/ulnar distribution.  RP 2+ and equal.  He is able to move his shoulder through full range of motion without any problem.  Strength in shoulder, elbow, grip 5/5 and equal bilaterally. Neurologic: Alert & oriented x 3, no focal neuro deficits.  Cranial nerves III through XII grossly intact.  Coordination intact.  Speech fluent Psychiatric: Speech and behavior appropriate   ED Course   Medications - No data to display  No orders of the defined types were placed in this encounter.   No results found for this or any previous visit (from the past 24 hour(s)). No results found.  ED Clinical Impression  1. Cervical radiculopathy      ED Assessment/Plan  Outside records reviewed.  As noted in HPI.  Presentation consistent with worsening of known arthritis/cervical radiculopathy.  Will have patient start 400 mg ibuprofen combined with 1000 mg of Tylenol 3-4 times a day, Medrol Dosepak, gentle stretching, heat or ice, whichever feels better.  He plans to call his neurosurgeon on Monday.  Doubt cardiac cause of his pain or stroke.  Discussed MDM, treatment plan, and plan for follow-up with patient. . patient agrees with plan.   Meds ordered this encounter  Medications   methylPREDNISolone (MEDROL DOSEPAK) 4 MG TBPK tablet    Sig: Take by mouth daily. Follow package instructions    Dispense:  21 tablet    Refill:  0     *This clinic note was created using Dragon dictation software. Therefore, there may be occasional mistakes despite careful proofreading.  ?    Melynda Ripple, MD 04/30/21 [phone]

## 2021-05-17 ENCOUNTER — Ambulatory Visit (INDEPENDENT_AMBULATORY_CARE_PROVIDER_SITE_OTHER): Payer: Medicare Other

## 2021-05-17 ENCOUNTER — Other Ambulatory Visit: Payer: Self-pay

## 2021-05-17 DIAGNOSIS — L570 Actinic keratosis: Secondary | ICD-10-CM

## 2021-05-17 MED ORDER — AMINOLEVULINIC ACID HCL 20 % EX SOLR
1.0000 "application " | Freq: Once | CUTANEOUS | Status: AC
  Administered 2021-05-17: 354 mg via TOPICAL

## 2021-05-17 NOTE — Patient Instructions (Signed)

## 2021-05-17 NOTE — Progress Notes (Signed)
Patient completed PDT therapy today.  1. AK (actinic keratosis) Scalp  Photodynamic therapy - Scalp Procedure discussed: discussed risks, benefits, side effects. and alternatives   Prep: site scrubbed/prepped with acetone   Location:  Scalp Number of lesions:  Multiple Type of treatment:  Blue light Aminolevulinic Acid (see MAR for details): Levulan Number of Levulan sticks used:  1 Incubation time (minutes):  120 Number of minutes under lamp:  16 Number of seconds under lamp:  40 Cooling:  Floor fan Outcome: patient tolerated procedure well with no complications   Post-procedure details: sunscreen applied     

## 2021-05-30 ENCOUNTER — Other Ambulatory Visit: Payer: Self-pay | Admitting: Neurosurgery

## 2021-05-30 DIAGNOSIS — M5412 Radiculopathy, cervical region: Secondary | ICD-10-CM

## 2021-05-30 DIAGNOSIS — M542 Cervicalgia: Secondary | ICD-10-CM

## 2021-06-06 ENCOUNTER — Other Ambulatory Visit: Payer: Self-pay

## 2021-06-06 ENCOUNTER — Ambulatory Visit
Admission: RE | Admit: 2021-06-06 | Discharge: 2021-06-06 | Disposition: A | Discharge status: Home / Self Care | Payer: Medicare Other | Source: Ambulatory Visit | Attending: Neurosurgery | Admitting: Neurosurgery

## 2021-06-06 DIAGNOSIS — M542 Cervicalgia: Secondary | ICD-10-CM | POA: Diagnosis present

## 2021-06-06 DIAGNOSIS — M5412 Radiculopathy, cervical region: Secondary | ICD-10-CM | POA: Diagnosis not present

## 2021-06-28 ENCOUNTER — Other Ambulatory Visit: Payer: Self-pay

## 2021-06-28 ENCOUNTER — Ambulatory Visit (INDEPENDENT_AMBULATORY_CARE_PROVIDER_SITE_OTHER): Payer: Medicare Other

## 2021-06-28 DIAGNOSIS — L570 Actinic keratosis: Secondary | ICD-10-CM

## 2021-06-28 MED ORDER — AMINOLEVULINIC ACID HCL 20 % EX SOLR
2.0000 "application " | Freq: Once | CUTANEOUS | Status: AC
  Administered 2021-06-28: 708 mg via TOPICAL

## 2021-06-28 NOTE — Patient Instructions (Signed)

## 2021-06-28 NOTE — Progress Notes (Signed)
Patient completed PDT therapy today.  1. AK (actinic keratosis) (4) Left Forearm - Posterior; Right Forearm - Posterior; Left Dorsal Hand; Right Dorsal Hand  Photodynamic therapy - Left Dorsal Hand, Left Forearm - Posterior, Right Dorsal Hand, Right Forearm - Posterior Procedure discussed: discussed risks, benefits, side effects. and alternatives   Prep: site scrubbed/prepped with acetone   Location:  Arms and hands Number of lesions:  Multiple Type of treatment:  Blue light Aminolevulinic Acid (see MAR for details): Levulan Number of Levulan sticks used:  2 Incubation time (minutes):  120 Number of minutes under lamp:  16 Number of seconds under lamp:  40 Cooling:  Floor fan Outcome: patient tolerated procedure well with no complications   Post-procedure details: sunscreen applied    Aminolevulinic Acid HCl 20 % SOLR 708 mg - Left Dorsal Hand, Left Forearm - Posterior, Right Dorsal Hand, Right Forearm - Posterior   

## 2021-09-06 ENCOUNTER — Other Ambulatory Visit: Payer: Self-pay

## 2021-09-06 ENCOUNTER — Ambulatory Visit (INDEPENDENT_AMBULATORY_CARE_PROVIDER_SITE_OTHER): Payer: Medicare Other | Admitting: Dermatology

## 2021-09-06 DIAGNOSIS — L820 Inflamed seborrheic keratosis: Secondary | ICD-10-CM | POA: Diagnosis not present

## 2021-09-06 DIAGNOSIS — L578 Other skin changes due to chronic exposure to nonionizing radiation: Secondary | ICD-10-CM | POA: Diagnosis not present

## 2021-09-06 DIAGNOSIS — L821 Other seborrheic keratosis: Secondary | ICD-10-CM

## 2021-09-06 DIAGNOSIS — L570 Actinic keratosis: Secondary | ICD-10-CM

## 2021-09-06 MED ORDER — FLUOROURACIL 5 % EX CREA
TOPICAL_CREAM | Freq: Two times a day (BID) | CUTANEOUS | 1 refills | Status: DC
Start: 2021-09-06 — End: 2022-03-11

## 2021-09-06 NOTE — Patient Instructions (Addendum)
Cryotherapy Aftercare ? ?Wash gently with soap and water everyday.   ?Apply Vaseline and Band-Aid daily until healed.  ? ? ? ? ?5-Fluorouracil/Calcipotriene Patient Education  ? ?Start 5FU/Calcipotriene cream apply to forehead and temples twice a day x 7 days  ? ?Actinic keratoses are the dry, red scaly spots on the skin caused by sun damage. A portion of these spots can turn into skin cancer with time, and treating them can help prevent development of skin cancer.  ? ?Treatment of these spots requires removal of the defective skin cells. There are various ways to remove actinic keratoses, including freezing with liquid nitrogen, treatment with creams, or treatment with a blue light procedure in the office.  ? ?5-fluorouracil cream is a topical cream used to treat actinic keratoses. It works by interfering with the growth of abnormal fast-growing skin cells, such as actinic keratoses. These cells peel off and are replaced by healthy ones.  ? ?5-fluorouracil/calcipotriene is a combination of the 5-fluorouracil cream with a vitamin D analog cream called calcipotriene. The calcipotriene alone does not treat actinic keratoses. However, when it is combined with 5-fluorouracil, it helps the 5-fluorouracil treat the actinic keratoses much faster so that the same results can be achieved with a much shorter treatment time. ? ?INSTRUCTIONS FOR 5-FLUOROURACIL/CALCIPOTRIENE CREAM:  ? ?5-fluorouracil/calcipotriene cream typically only needs to be used for 4-7 days. A thin layer should be applied twice a day to the treatment areas recommended by your physician.  ? ?If your physician prescribed you separate tubes of 5-fluourouracil and calcipotriene, apply a thin layer of 5-fluorouracil followed by a thin layer of calcipotriene.  ? ?Avoid contact with your eyes, nostrils, and mouth. Do not use 5-fluorouracil/calcipotriene cream on infected or open wounds.  ? ?You will develop redness, irritation and some crusting at areas where  you have pre-cancer damage/actinic keratoses. IF YOU DEVELOP PAIN, BLEEDING, OR SIGNIFICANT CRUSTING, STOP THE TREATMENT EARLY - you have already gotten a good response and the actinic keratoses should clear up well. ? ?Wash your hands after applying 5-fluorouracil 5% cream on your skin.  ? ?A moisturizer or sunscreen with a minimum SPF 30 should be applied each morning.  ? ?Once you have finished the treatment, you can apply a thin layer of Vaseline twice a day to irritated areas to soothe and calm the areas more quickly. If you experience significant discomfort, contact your physician. ? ?For some patients it is necessary to repeat the treatment for best results. ? ?SIDE EFFECTS: When using 5-fluorouracil/calcipotriene cream, you may have mild irritation, such as redness, dryness, swelling, or a mild burning sensation. This usually resolves within 2 weeks. The more actinic keratoses you have, the more redness and inflammation you can expect during treatment. Eye irritation has been reported rarely. If this occurs, please let us know.  ?If you have any trouble using this cream, please call the office. If you have any other questions about this information, please do not hesitate to ask me before you leave the office. ? ? ? ? ?If You Need Anything After Your Visit ? ?If you have any questions or concerns for your doctor, please call our main line at [phone] and press option 4 to reach your doctor's medical assistant. If no one answers, please leave a voicemail as directed and we will return your call as soon as possible. Messages left after 4 pm will be answered the following business day.  ? ?You may also send [place] a message via MyChart. We typically  respond to MyChart messages within 1-2 business days. ? ?For prescription refills, please ask your pharmacy to contact our office. Our fax number is [phone]. ? ?If you have an urgent issue when the clinic is closed that cannot wait until the next business day,  you can page your doctor at the number below.   ? ?Please note that while we do our best to be available for urgent issues outside of office hours, we are not available 24/7.  ? ?If you have an urgent issue and are unable to reach [place], you may choose to seek medical care at your doctor's office, retail clinic, urgent care center, or emergency room. ? ?If you have a medical emergency, please immediately call 911 or go to the emergency department. ? ?Pager Numbers ? ?- Dr. Nehemiah Massed: [phone] ? ?- Dr. Laurence Ferrari: [phone] ? ?- Dr. Nicole Kindred: [phone] ? ?In the event of inclement weather, please call our main line at [phone] for an update on the status of any delays or closures. ? ?Dermatology Medication Tips: ?Please keep the boxes that topical medications come in in order to help keep track of the instructions about where and how to use these. Pharmacies typically print the medication instructions only on the boxes and not directly on the medication tubes.  ? ?If your medication is too expensive, please contact our office at [phone] option 4 or send [place] a message through [place].  ? ?We are unable to tell what your co-pay for medications will be in advance as this is different depending on your insurance coverage. However, we may be able to find a substitute medication at lower cost or fill out paperwork to get insurance to cover a needed medication.  ? ?If a prior authorization is required to get your medication covered by your insurance company, please allow [place] 1-2 business days to complete this process. ? ?Drug prices often vary depending on where the prescription is filled and some pharmacies may offer cheaper prices. ? ?The website www.goodrx.com contains coupons for medications through different pharmacies. The prices here do not account for what the cost may be with help from insurance (it may be cheaper with your insurance), but the website can give you the price if you did not use any insurance.   ?- You can print the associated coupon and take it with your prescription to the pharmacy.  ?- You may also stop by our office during regular business hours and pick up a GoodRx coupon card.  ?- If you need your prescription sent electronically to a different pharmacy, notify our office through [facility] or by phone at [phone] option 4. ? ? ? ? ?Si Usted Necesita Algo Despu?s de Su Visita ? ?Tambi?n puede enviarnos un mensaje a trav?s de MyChart. Por lo general respondemos a los mensajes de MyChart en el transcurso de 1 a 2 d?as h?biles. ? ?Para renovar recetas, por favor pida a su farmacia que se ponga en contacto con nuestra oficina. Nuestro n?mero de fax es el [phone]. ? ?Si tiene un asunto urgente cuando la cl?nica est? cerrada y que no puede esperar hasta el siguiente d?a h?bil, puede llamar/localizar a su doctor(a) al n?mero que aparece a continuaci?n.  ? ?Por favor, tenga en cuenta que aunque hacemos todo lo posible para estar disponibles para asuntos urgentes fuera del horario de oficina, no estamos disponibles las 24 horas del d?a, los 7 d?as de la semana.  ? ?Si tiene un problema urgente y no puede comunicarse con  nosotros, puede optar por buscar atenci?n m?dica  en el consultorio de su doctor(a), en una cl?nica privada, en un centro de atenci?n urgente o en una sala de emergencias. ? ?Si tiene Engineer, maintenance (IT) m?dica, por favor llame inmediatamente al 911 o vaya a la sala de emergencias. ? ?N?meros de b?per ? ?- Dr. Nehemiah Massed: [phone] ? ?- Dra. Moye: [phone] ? ?- Dra. Nicole Kindred: [phone] ? ?En caso de inclemencias del tiempo, por favor llame a nuestra l?nea principal al [phone] para una actualizaci?n sobre el estado de cualquier retraso o cierre. ? ?Consejos para la medicaci?n en dermatolog?a: ?Por favor, guarde las cajas en las que vienen los medicamentos de uso t?pico para ayudarle a seguir las instrucciones sobre d?nde y c?mo usarlos. Las farmacias generalmente  imprimen las instrucciones del medicamento s?lo en las cajas y no directamente en los tubos del [place].  ? ?Si su medicamento es muy caro, por favor, p?ngase en contacto con nuestra oficina llamando al 33

## 2021-09-06 NOTE — Progress Notes (Signed)
? ?Follow-Up Visit ?  ?Subjective  ?Joe Tyler is a 81 y.o. male who presents for the following: Actinic Keratosis (6 months f/u ). ?The patient has spots, moles and lesions to be evaluated, some may be new or changing and the patient has concerns that these could be cancer. ? ?The following portions of the chart were reviewed this encounter and updated as appropriate:  ? Tobacco  Allergies  Meds  Problems  Med Hx  Surg Hx  Fam Hx   ?  ?Review of Systems:  No other skin or systemic complaints except as noted in HPI or Assessment and Plan. ? ?Objective  ?Well appearing patient in no apparent distress; mood and affect are within normal limits. ? ?A focused examination was performed including face,scalp,arms,hands. Relevant physical exam findings are noted in the Assessment and Plan. ? ?Scalp x 1, arms,hands x ?Stuck-on, waxy, tan-brown papule.  ? ?face, scalp x 11 (11) ?Erythematous thin papules/macules with gritty scale.  ? ? ?Assessment & Plan  ?Inflamed seborrheic keratosis ?Scalp x 1, arms,hands x ? ?Reassured benign age-related growth.  Recommend observation.  Discussed cryotherapy if spot(s) become irritated or inflamed.  ? ?Destruction of lesion - Scalp x 1, arms,hands x ?Complexity: simple   ?Destruction method: cryotherapy   ?Informed consent: discussed and consent obtained   ?Timeout:  patient name, date of birth, surgical site, and procedure verified ?Lesion destroyed using liquid nitrogen: Yes   ?Region frozen until ice ball extended beyond lesion: Yes   ?Outcome: patient tolerated procedure well with no complications   ?Post-procedure details: wound care instructions given   ? ?Related Medications ?fluorouracil (EFUDEX) 5 % cream ?Apply topically 2 (two) times daily. Apply to forehead and temples twice a day x 7 days ? ?AK (actinic keratosis) (11) ?face, scalp x 11 ? ?Actinic keratoses are precancerous spots that appear secondary to cumulative UV radiation exposure/sun exposure over time. They  are chronic with expected duration over 1 year. A portion of actinic keratoses will progress to squamous cell carcinoma of the skin. It is not possible to reliably predict which spots will progress to skin cancer and so treatment is recommended to prevent development of skin cancer. ? ?Recommend daily broad spectrum sunscreen SPF 30+ to sun-exposed areas, reapply every 2 hours as needed.  ?Recommend staying in the shade or wearing long sleeves, sun glasses (UVA+UVB protection) and wide brim hats (4-inch brim around the entire circumference of the hat). ?Call for new or changing lesions.  ? ?Destruction of lesion - face, scalp x 11 ?Complexity: simple   ?Destruction method: cryotherapy   ?Informed consent: discussed and consent obtained   ?Timeout:  patient name, date of birth, surgical site, and procedure verified ?Lesion destroyed using liquid nitrogen: Yes   ?Region frozen until ice ball extended beyond lesion: Yes   ?Outcome: patient tolerated procedure well with no complications   ?Post-procedure details: wound care instructions given   ? ?Actinic Damage - Severe, confluent actinic changes with pre-cancerous actinic keratoses  ?- Severe, chronic, not at goal, secondary to cumulative UV radiation exposure over time ?- diffuse scaly erythematous macules and papules with underlying dyspigmentation ?- Discussed Prescription "Field Treatment" for Severe, Chronic Confluent Actinic Changes with Pre-Cancerous Actinic Keratoses ? ?Start 5FU/Calcipotriene cream apply to forehead and temples twice a day x 7 days  ?Field treatment involves treatment of an entire area of skin that has confluent Actinic Changes (Sun/ Ultraviolet light damage) and PreCancerous Actinic Keratoses by method of PhotoDynamic Therapy (PDT) and/or prescription  Topical Chemotherapy agents such as 5-fluorouracil, 5-fluorouracil/calcipotriene, and/or imiquimod.  The purpose is to decrease the number of clinically evident and subclinical PreCancerous  lesions to prevent progression to development of skin cancer by chemically destroying early precancer changes that may or may not be visible.  It has been shown to reduce the risk of developing skin cancer in the treated area. As a result of treatment, redness, scaling, crusting, and open sores may occur during treatment course. One or more than one of these methods may be used and may have to be used several times to control, suppress and eliminate the PreCancerous changes. Discussed treatment course, expected reaction, and possible side effects. ?- Recommend daily broad spectrum sunscreen SPF 30+ to sun-exposed areas, reapply every 2 hours as needed.  ?- Staying in the shade or wearing long sleeves, sun glasses (UVA+UVB protection) and wide brim hats (4-inch brim around the entire circumference of the hat) are also recommended. ?- Call for new or changing lesions.  ? ?Seborrheic Keratoses ?- Stuck-on, waxy, tan-brown papules and/or plaques  ?- Benign-appearing ?- Discussed benign etiology and prognosis. ?- Observe ?- Call for any changes ? ?Return in about 6 months (around 03/09/2022) for AKs . ? ?I, Joe Tyler, CMA, am acting as scribe for Sarina Ser, MD .  ?Documentation: I have reviewed the above documentation for accuracy and completeness, and I agree with the above. ? ?Sarina Ser, MD ? ?

## 2021-09-10 ENCOUNTER — Encounter: Payer: Self-pay | Admitting: Dermatology

## 2022-02-02 IMAGING — MR MR CERVICAL SPINE W/O CM
5 series · 39 of 48 positions shown · non-contrast
Comparison: Cervical spine MRI 06/11/2018.

CLINICAL DATA: 80-year-old male with progressive neck pain
radiating to the left shoulder, and into the head.

EXAM:
MRI CERVICAL SPINE WITHOUT CONTRAST
TECHNIQUE: Multiplanar, multisequence MR imaging of the cervical spine was
performed. No intravenous contrast was administered.

[Series 3: T2 · sagittal · 3.0mm · 0.86mm/px · 6 of 13 slices shown (1 of 2)]
[im 1/13]
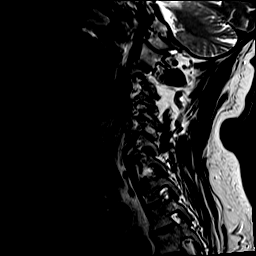
[im 3/13]
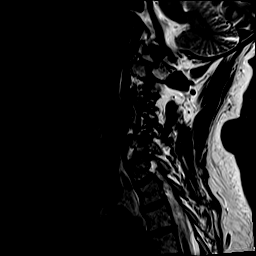
[im 5/13]
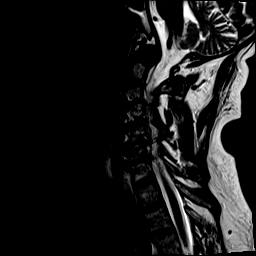
[im 8/13]
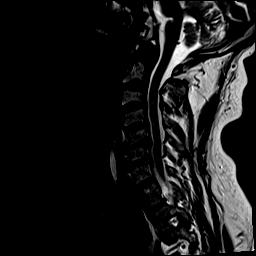
[im 10/13]
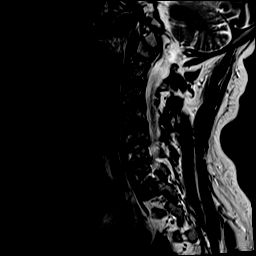
[im 13/13]
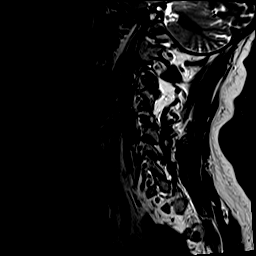

[Series 4: T1 · sagittal · 3.0mm · 0.86mm/px · 7 of 13 slices shown]
[im 1/13]
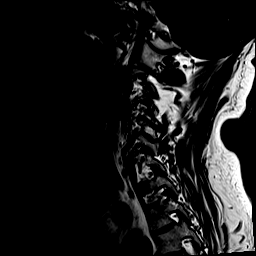
[im 3/13]
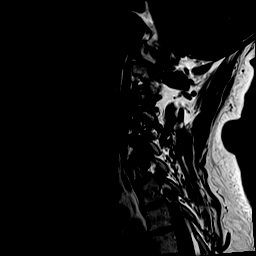
[im 5/13]
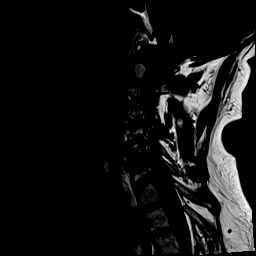
[im 7/13]
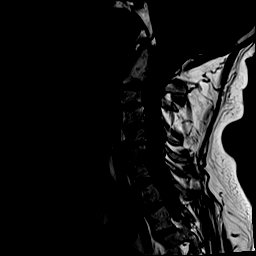
[im 9/13]
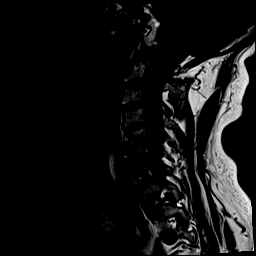
[im 11/13]
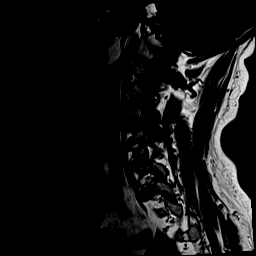
[im 13/13]
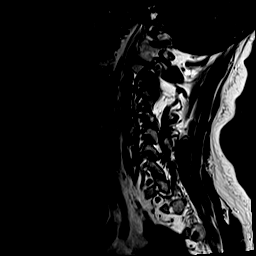

[Series 5: STIR · sagittal · 3.0mm · 0.69mm/px · 7 of 13 slices shown]
[im 1/13]
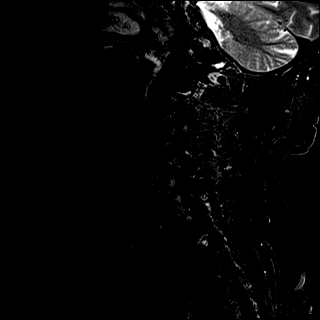
[im 3/13]
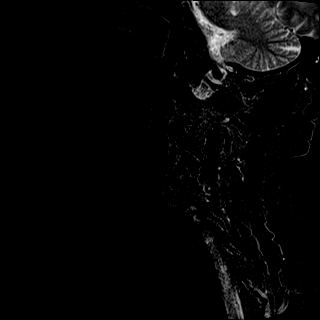
[im 5/13]
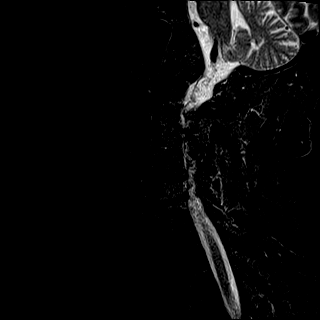
[im 7/13]
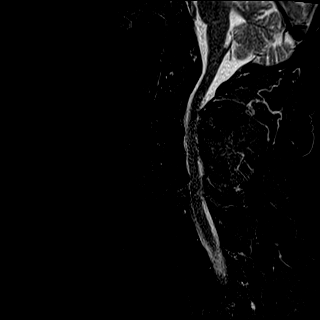
[im 9/13]
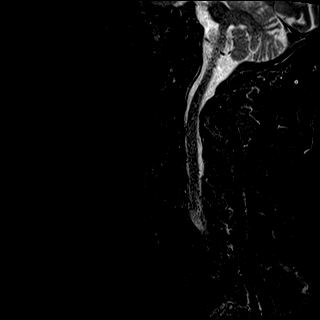
[im 11/13]
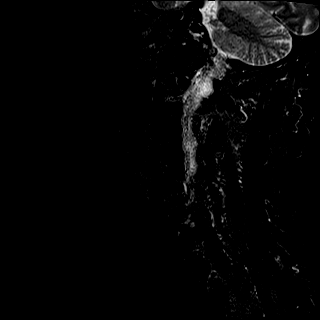
[im 13/13]
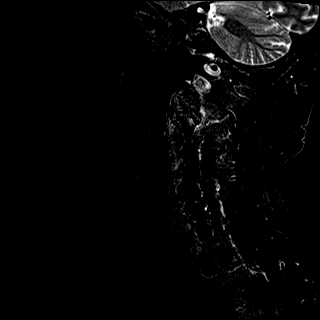

[Series 6: T2 · axial · 3.0mm · 0.62mm/px · z∈[-109,-16]mm · 11 of 27 slices shown (2 of 2)]
[im 1/27]
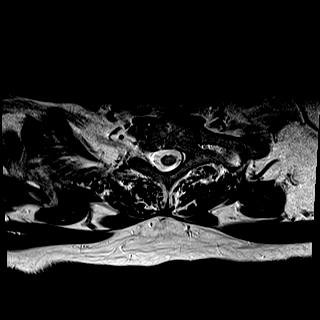
[im 3/27]
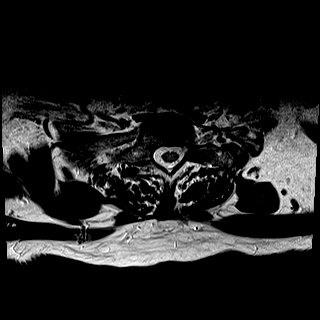
[im 5/27]
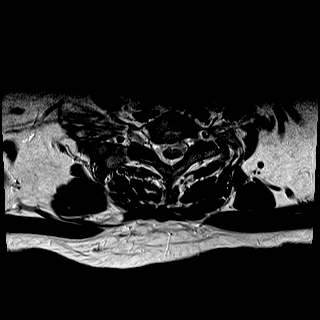
[im 7/27]
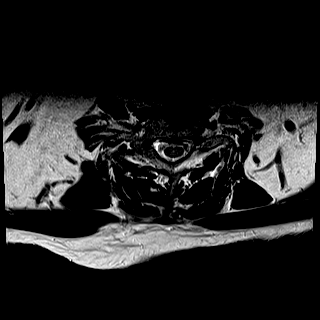
[im 9/27]
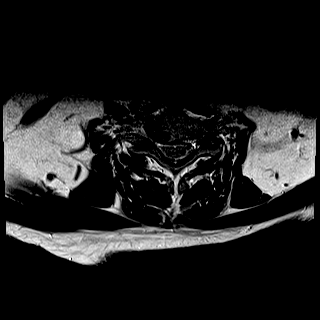
[im 11/27]
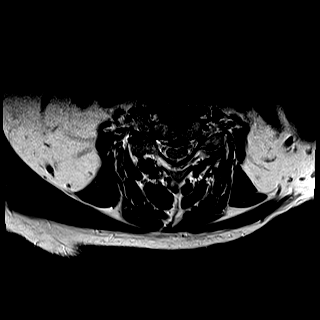
[im 13/27]
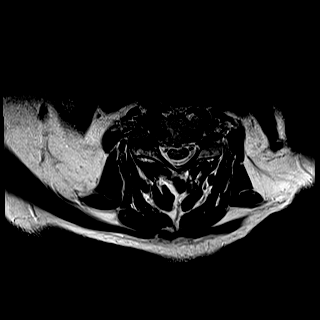
[im 15/27]
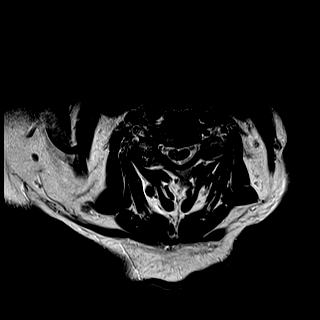
[im 19/27]
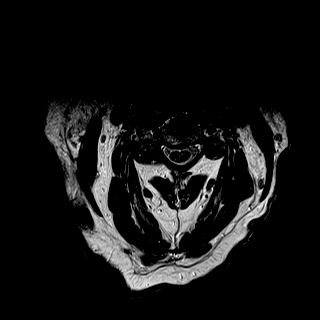
[im 23/27]
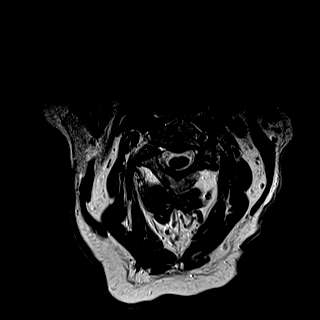
[im 27/27]
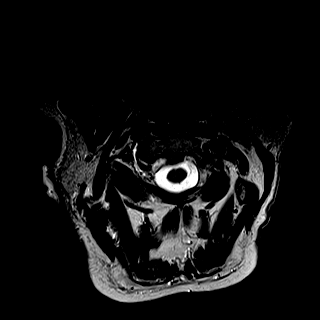

[Series 7: mpgr ax · axial · 3.0mm · 0.35mm/px · z∈[-96,-3]mm · 8 of 27 slices shown]
[im 1/27]
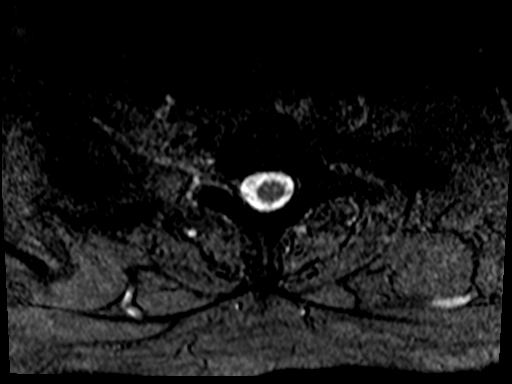
[im 5/27]
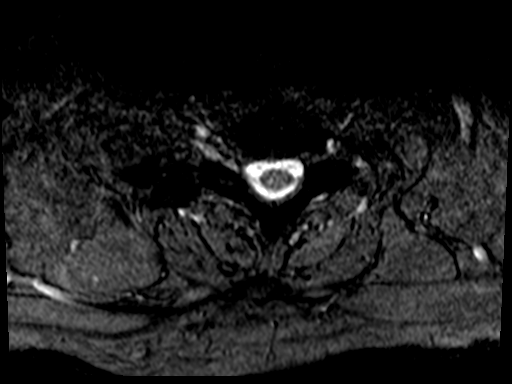
[im 9/27]
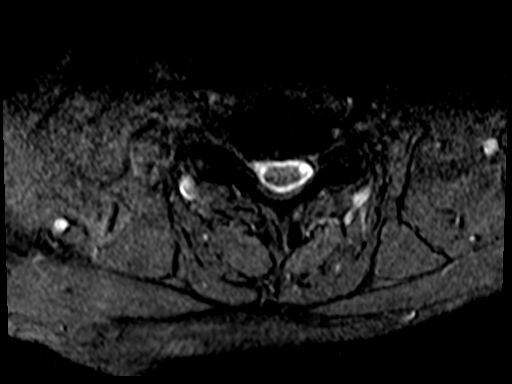
[im 13/27]
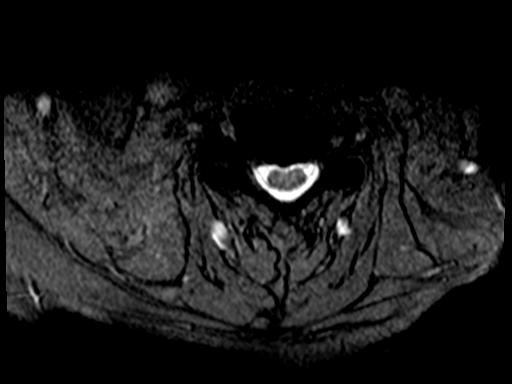
[im 15/27]
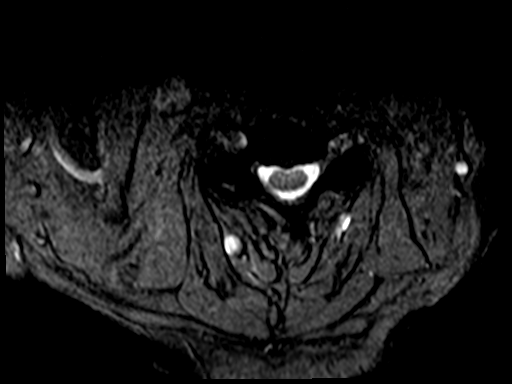
[im 19/27]
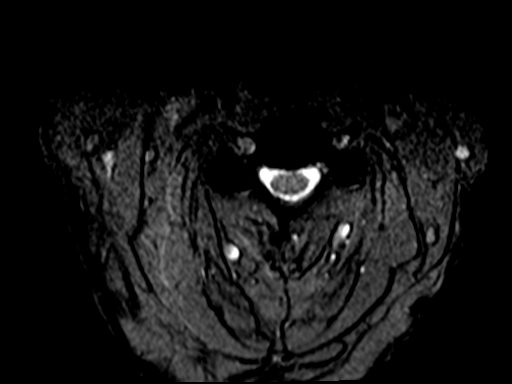
[im 23/27]
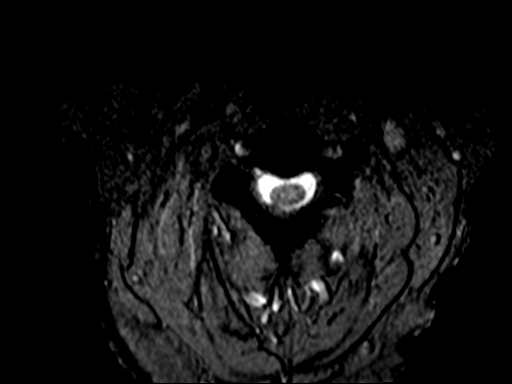
[im 27/27]
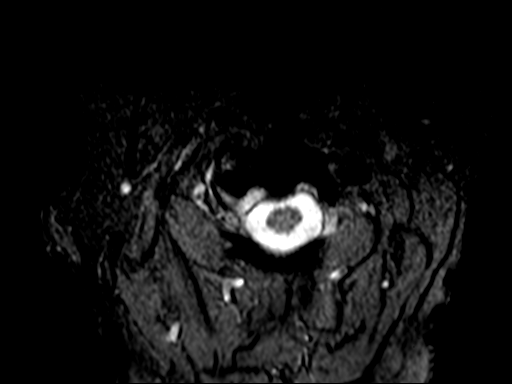

[39 of 48 positions shown; findings below may reference images not displayed]

FINDINGS: Alignment: Mild chronic levoconvex cervical spine scoliosis. Chronic
straightening of lordosis with mild chronic spondylolisthesis at
C3-C4, C4-C5, and C5-C6.

Vertebrae: Confluent marrow edema in the chronically degenerated
left C2-C3 facets is new from 0350 and appears degenerative in
nature (series 5, image 13). Trace associated facet joint fluid
there. Faint degenerative marrow edema also at the right C6-C7
endplates (series 5, image 5). No other marrow edema or evidence of
acute osseous abnormality. Normal background bone marrow signal.

Cord: Normal.

Posterior Fossa, vertebral arteries, paraspinal tissues:
Cervicomedullary junction is within normal limits. Negative visible
posterior fossa. Preserved major vascular flow voids in the neck
with dominant right vertebral artery, stable since 0350. negative
visible neck soft tissues.

Disc levels:

C2-C3: Progressed and moderate facet hypertrophy on the left since
0350. Associated trace facet joint fluid and marrow edema as above.
Mild foraminal endplate spurring. Mild ligament flavum hypertrophy.
No spinal stenosis. Mild to moderate left C3 foraminal stenosis is
increased.

C3-C4: Subtle anterolisthesis. Moderate facet hypertrophy greater on
the right. Rightward disc bulging and endplate spurring. No spinal
stenosis. Moderate to severe right C4 foraminal stenosis is stable.

C4-C5: Mild anterolisthesis. Moderate facet hypertrophy greater on
the right. Mild disc bulging endplate spurring. No spinal stenosis.
Moderate bilateral C5 foraminal stenosis is stable.

C5-C6: Chronic disc space loss and circumferential disc osteophyte
complex. Mild to moderate facet hypertrophy greater on the right.
Chronic broad-based central disc protrusion. Mild spinal stenosis.
No cord mass effect. Mild to moderate left and severe right C6
foraminal stenosis. This level is stable.

C6-C7: Chronic disc space loss. Circumferential disc osteophyte
complex. Mild facet hypertrophy. No significant spinal stenosis.
Moderate to severe bilateral C7 foraminal stenosis. This level
stable.

C7-T1:  Mild facet hypertrophy.  No stenosis.

No visible upper thoracic spinal stenosis.
IMPRESSION: 1. Progressed facet arthropathy at C2-C3 since 0350 on the left with
associated marrow edema compatible with acute exacerbation of the
chronic facet joint arthritis. This might be the symptomatic
abnormality with regard to pain radiating to the left. Associated
mild to moderate left C3 foraminal stenosis has also increased.

2. Mild chronic spondylolisthesis C3-C4 through C5-C6 also with
associated chronic facet degeneration. Advanced chronic disc and
endplate degeneration at C5-C6 and C6-C7.
Stable mild spinal stenosis at C5-C6.
And stable moderate or severe neural foraminal stenosis at the right
C4, bilateral C5, right C6 and bilateral C7 nerve levels.

## 2022-02-06 ENCOUNTER — Telehealth: Payer: Self-pay | Admitting: Genetic Counselor

## 2022-02-06 NOTE — Telephone Encounter (Signed)
Scheduled appt per 8/22 referral. Pt is aware of appt date and time. Pt is aware to arrive 15 mins prior to appt time and to bring and updated insurance card. Pt is aware of appt location.   

## 2022-03-11 ENCOUNTER — Ambulatory Visit (INDEPENDENT_AMBULATORY_CARE_PROVIDER_SITE_OTHER): Payer: Medicare Other | Admitting: Dermatology

## 2022-03-11 DIAGNOSIS — L578 Other skin changes due to chronic exposure to nonionizing radiation: Secondary | ICD-10-CM

## 2022-03-11 DIAGNOSIS — L814 Other melanin hyperpigmentation: Secondary | ICD-10-CM

## 2022-03-11 DIAGNOSIS — L820 Inflamed seborrheic keratosis: Secondary | ICD-10-CM | POA: Diagnosis not present

## 2022-03-11 DIAGNOSIS — D1801 Hemangioma of skin and subcutaneous tissue: Secondary | ICD-10-CM

## 2022-03-11 DIAGNOSIS — L570 Actinic keratosis: Secondary | ICD-10-CM

## 2022-03-11 DIAGNOSIS — L821 Other seborrheic keratosis: Secondary | ICD-10-CM

## 2022-03-11 DIAGNOSIS — Z79899 Other long term (current) drug therapy: Secondary | ICD-10-CM

## 2022-03-11 DIAGNOSIS — D229 Melanocytic nevi, unspecified: Secondary | ICD-10-CM

## 2022-03-11 DIAGNOSIS — Z1283 Encounter for screening for malignant neoplasm of skin: Secondary | ICD-10-CM

## 2022-03-11 DIAGNOSIS — Z5111 Encounter for antineoplastic chemotherapy: Secondary | ICD-10-CM

## 2022-03-11 NOTE — Patient Instructions (Addendum)
Instructions for [facility] Medications  One or more of your medications was sent to the [facility] mail order compounding pharmacy. You will receive an email from them and can purchase the medicine through that link. It will then be mailed to your home at the address you confirmed. If for any reason you do not receive an email from them, please check your spam folder. If you still do not find the email, please let us know. [facility] phone number is [phone].    5-Fluorouracil/Calcipotriene Patient Education  Start in November  5FU/Calcipotriene cream apply to scalp twice a day x 7 days   Actinic keratoses are the dry, red scaly spots on the skin caused by sun damage. A portion of these spots can turn into skin cancer with time, and treating them can help prevent development of skin cancer.   Treatment of these spots requires removal of the defective skin cells. There are various ways to remove actinic keratoses, including freezing with liquid nitrogen, treatment with creams, or treatment with a blue light procedure in the office.   5-fluorouracil cream is a topical cream used to treat actinic keratoses. It works by interfering with the growth of abnormal fast-growing skin cells, such as actinic keratoses. These cells peel off and are replaced by healthy ones.   5-fluorouracil/calcipotriene is a combination of the 5-fluorouracil cream with a vitamin D analog cream called calcipotriene. The calcipotriene alone does not treat actinic keratoses. However, when it is combined with 5-fluorouracil, it helps the 5-fluorouracil treat the actinic keratoses much faster so that the same results can be achieved with a much shorter treatment time.  INSTRUCTIONS FOR 5-FLUOROURACIL/CALCIPOTRIENE CREAM:   5-fluorouracil/calcipotriene cream typically only needs to be used for 4-7 days. A thin layer should be applied twice a day to the treatment areas recommended by your physician.   If your  physician prescribed you separate tubes of 5-fluourouracil and calcipotriene, apply a thin layer of 5-fluorouracil followed by a thin layer of calcipotriene.   Avoid contact with your eyes, nostrils, and mouth. Do not use 5-fluorouracil/calcipotriene cream on infected or open wounds.   You will develop redness, irritation and some crusting at areas where you have pre-cancer damage/actinic keratoses. IF YOU DEVELOP PAIN, BLEEDING, OR SIGNIFICANT CRUSTING, STOP THE TREATMENT EARLY - you have already gotten a good response and the actinic keratoses should clear up well.  Wash your hands after applying 5-fluorouracil 5% cream on your skin.   A moisturizer or sunscreen with a minimum SPF 30 should be applied each morning.   Once you have finished the treatment, you can apply a thin layer of Vaseline twice a day to irritated areas to soothe and calm the areas more quickly. If you experience significant discomfort, contact your physician.  For some patients it is necessary to repeat the treatment for best results.  SIDE EFFECTS: When using 5-fluorouracil/calcipotriene cream, you may have mild irritation, such as redness, dryness, swelling, or a mild burning sensation. This usually resolves within 2 weeks. The more actinic keratoses you have, the more redness and inflammation you can expect during treatment. Eye irritation has been reported rarely. If this occurs, please let us know.  If you have any trouble using this cream, please call the office. If you have any other questions about this information, please do not hesitate to ask me before you leave the office.      Cryotherapy Aftercare  Wash gently with soap and water everyday.   Apply Vaseline and  Band-Aid daily until healed.     Due to recent changes in healthcare laws, you may see results of your pathology and/or laboratory studies on MyChart before the doctors have had a chance to review them. We understand that in some cases there may  be results that are confusing or concerning to you. Please understand that not all results are received at the same time and often the doctors may need to interpret multiple results in order to provide you with the best plan of care or course of treatment. Therefore, we ask that you please give [place] 2 business days to thoroughly review all your results before contacting the office for clarification. Should we see a critical lab result, you will be contacted sooner.   If You Need Anything After Your Visit  If you have any questions or concerns for your doctor, please call our main line at [phone] and press option 4 to reach your doctor's medical assistant. If no one answers, please leave a voicemail as directed and we will return your call as soon as possible. Messages left after 4 pm will be answered the following business day.   You may also send [place] a message via [place]. We typically respond to MyChart messages within 1-2 business days.  For prescription refills, please ask your pharmacy to contact our office. Our fax number is [phone].  If you have an urgent issue when the clinic is closed that cannot wait until the next business day, you can page your doctor at the number below.    Please note that while we do our best to be available for urgent issues outside of office hours, we are not available 24/7.   If you have an urgent issue and are unable to reach [place], you may choose to seek medical care at your doctor's office, retail clinic, urgent care center, or emergency room.  If you have a medical emergency, please immediately call 911 or go to the emergency department.  Pager Numbers  - Dr. Nehemiah Massed: [phone]  - Dr. Laurence Ferrari: [phone]  - Dr. Nicole Kindred: [phone]  In the event of inclement weather, please call our main line at [phone] for an update on the status of any delays or closures.  Dermatology Medication Tips: Please keep the boxes that topical medications  come in in order to help keep track of the instructions about where and how to use these. Pharmacies typically print the medication instructions only on the boxes and not directly on the medication tubes.   If your medication is too expensive, please contact our office at [phone] option 4 or send [place] a message through [place].   We are unable to tell what your co-pay for medications will be in advance as this is different depending on your insurance coverage. However, we may be able to find a substitute medication at lower cost or fill out paperwork to get insurance to cover a needed medication.   If a prior authorization is required to get your medication covered by your insurance company, please allow [place] 1-2 business days to complete this process.  Drug prices often vary depending on where the prescription is filled and some pharmacies may offer cheaper prices.  The website www.goodrx.com contains coupons for medications through different pharmacies. The prices here do not account for what the cost may be with help from insurance (it may be cheaper with your insurance), but the website can give you the price if you did not use any insurance.  - You  can print the associated coupon and take it with your prescription to the pharmacy.  - You may also stop by our office during regular business hours and pick up a GoodRx coupon card.  - If you need your prescription sent electronically to a different pharmacy, notify our office through [facility] or by phone at [phone] option 4.     Si Usted Necesita Algo Despus de Su Visita  Tambin puede enviarnos un mensaje a travs de Pharmacist, community. Por lo general respondemos a los mensajes de MyChart en el transcurso de 1 a 2 das hbiles.  Para renovar recetas, por favor pida a su farmacia que se ponga en contacto con nuestra oficina. Harland Dingwall de fax es [place] [phone].  Si tiene un asunto urgente cuando la clnica est cerrada y que no  puede esperar hasta el siguiente da hbil, puede llamar/localizar a su doctor(a) al nmero que aparece a continuacin.   Por favor, tenga en cuenta que aunque hacemos todo lo posible para estar disponibles para asuntos urgentes fuera del horario de [place], no estamos disponibles las 24 horas del da, los 7 das de la [place].   Si tiene un problema urgente y no puede comunicarse con nosotros, puede optar por buscar atencin mdica  en el consultorio de su doctor(a), en una clnica privada, en un centro de atencin urgente o en una sala de emergencias.  Si tiene Engineering geologist, por favor llame inmediatamente al 911 o vaya a la sala de emergencias.  Nmeros de bper  - Dr. Nehemiah Massed: [phone]  - Dra. Moye: [phone]  - Dra. Nicole Kindred: [phone]  En caso de inclemencias del [place], por favor llame a Johnsie Kindred principal al [phone] para una actualizacin sobre el [place] de cualquier retraso o cierre.  Consejos para la medicacin en dermatologa: Por favor, guarde las cajas en las que vienen los medicamentos de uso tpico para ayudarle a seguir las instrucciones sobre dnde y cmo usarlos. Las farmacias generalmente imprimen las instrucciones del medicamento slo en las cajas y no directamente en los tubos del [place].   Si su medicamento es muy caro, por favor, pngase en contacto con Zigmund Daniel llamando al [phone] y presione la opcin 4 o envenos un mensaje a travs de Pharmacist, community.   No podemos decirle cul ser su copago por los medicamentos por adelantado ya que esto es diferente dependiendo de la cobertura de su seguro. Sin embargo, es posible que podamos encontrar un medicamento sustituto a Electrical engineer un formulario para que el seguro cubra el medicamento que se considera necesario.   Si se requiere una autorizacin previa para que su compaa de seguros [place] su medicamento, por favor permtanos de 1 a 2 das hbiles para completar este  proceso.  Los precios de los medicamentos varan con frecuencia dependiendo del Environmental consultant de dnde se surte la receta y alguna farmacias pueden ofrecer precios ms baratos.  El sitio web www.goodrx.com tiene cupones para medicamentos de Airline pilot. Los precios aqu no tienen en cuenta lo que podra costar con la ayuda del seguro (puede ser ms barato con su seguro), pero el sitio web puede darle el precio si no utiliz Research scientist (physical sciences).  - Puede imprimir el cupn correspondiente y llevarlo con su receta a la farmacia.  - Tambin puede pasar por nuestra oficina durante el horario de atencin regular y Charity fundraiser una tarjeta de cupones de GoodRx.  - Si necesita que su receta se enve electrnicamente a una farmacia diferente, informe a  nuestra oficina a travs de MyChart de [place] o por telfono llamando al [phone] y presione la opcin 4.

## 2022-03-11 NOTE — Progress Notes (Unsigned)
Follow-Up Visit   Subjective  Joe Tyler is a 81 y.o. male who presents for the following: Actinic Keratosis (6 months f/u ). The patient presents for Upper Body Skin Exam (UBSE) for skin cancer screening and mole check.  The patient has spots, moles and lesions to be evaluated, some may be new or changing and the patient has concerns that these could be cancer.   The following portions of the chart were reviewed this encounter and updated as appropriate:   Tobacco  Allergies  Meds  Problems  Med Hx  Surg Hx  Fam Hx     Review of Systems:  No other skin or systemic complaints except as noted in HPI or Assessment and Plan.  Objective  Well appearing patient in no apparent distress; mood and affect are within normal limits.  All skin waist up examined.  scalp x18, right ear x 2  (20) (20) Erythematous thin papules/macules with gritty scale.   back x 16, chest x 7  (23) (19) Stuck-on, waxy, tan-brown papules -- Discussed benign etiology and prognosis.    Assessment & Plan  AK (actinic keratosis) (20) scalp x18, right ear x 2  (20)  Actinic keratoses are precancerous spots that appear secondary to cumulative UV radiation exposure/sun exposure over time. They are chronic with expected duration over 1 year. A portion of actinic keratoses will progress to squamous cell carcinoma of the skin. It is not possible to reliably predict which spots will progress to skin cancer and so treatment is recommended to prevent development of skin cancer.  Recommend daily broad spectrum sunscreen SPF 30+ to sun-exposed areas, reapply every 2 hours as needed.  Recommend staying in the shade or wearing long sleeves, sun glasses (UVA+UVB protection) and wide brim hats (4-inch brim around the entire circumference of the hat). Call for new or changing lesions.   In November  Start 5FU/Calcipotriene cream apply to scalp twice a day for 7 days then stop   Destruction of lesion - scalp x18, right  ear x 2  (20) Complexity: simple   Destruction method: cryotherapy   Informed consent: discussed and consent obtained   Timeout:  patient name, date of birth, surgical site, and procedure verified Lesion destroyed using liquid nitrogen: Yes   Region frozen until ice ball extended beyond lesion: Yes   Outcome: patient tolerated procedure well with no complications   Post-procedure details: wound care instructions given    Inflamed seborrheic keratosis (19) back x 16, chest x 7  (23)  Symptomatic, irritating, patient would like treated.   Destruction of lesion - back x 16, chest x 7  (23) Complexity: simple   Destruction method: cryotherapy   Informed consent: discussed and consent obtained   Timeout:  patient name, date of birth, surgical site, and procedure verified Lesion destroyed using liquid nitrogen: Yes   Region frozen until ice ball extended beyond lesion: Yes   Outcome: patient tolerated procedure well with no complications   Post-procedure details: wound care instructions given    Lentigines - Scattered tan macules - Due to sun exposure - Benign-appearing, observe - Recommend daily broad spectrum sunscreen SPF 30+ to sun-exposed areas, reapply every 2 hours as needed. - Call for any changes  Seborrheic Keratoses - Stuck-on, waxy, tan-brown papules and/or plaques  - Benign-appearing - Discussed benign etiology and prognosis. - Observe - Call for any changes  Melanocytic Nevi - Tan-brown and/or pink-flesh-colored symmetric macules and papules - Benign appearing on exam today - Observation -  Call clinic for new or changing moles - Recommend daily use of broad spectrum spf 30+ sunscreen to sun-exposed areas.   Hemangiomas - Red papules - Discussed benign nature - Observe - Call for any changes  Actinic Damage - Severe, confluent actinic changes with pre-cancerous actinic keratoses  - Severe, chronic, not at goal, secondary to cumulative UV radiation exposure  over time - diffuse scaly erythematous macules and papules with underlying dyspigmentation - Discussed Prescription "Field Treatment" for Severe, Chronic Confluent Actinic Changes with Pre-Cancerous Actinic Keratoses  Start in November  5FU/Calcipotriene cream apply to scalp twice a day x 7 days  Field treatment involves treatment of an entire area of skin that has confluent Actinic Changes (Sun/ Ultraviolet light damage) and PreCancerous Actinic Keratoses by method of PhotoDynamic Therapy (PDT) and/or prescription Topical Chemotherapy agents such as 5-fluorouracil, 5-fluorouracil/calcipotriene, and/or imiquimod.  The purpose is to decrease the number of clinically evident and subclinical PreCancerous lesions to prevent progression to development of skin cancer by chemically destroying early precancer changes that may or may not be visible.  It has been shown to reduce the risk of developing skin cancer in the treated area. As a result of treatment, redness, scaling, crusting, and open sores may occur during treatment course. One or more than one of these methods may be used and may have to be used several times to control, suppress and eliminate the PreCancerous changes. Discussed treatment course, expected reaction, and possible side effects. - Recommend daily broad spectrum sunscreen SPF 30+ to sun-exposed areas, reapply every 2 hours as needed.  - Staying in the shade or wearing long sleeves, sun glasses (UVA+UVB protection) and wide brim hats (4-inch brim around the entire circumference of the hat) are also recommended. - Call for new or changing lesions.   Skin cancer screening performed today.   Return in about 6 months (around 09/09/2022) for Aks, ISKs.  IMarye Round, CMA, am acting as scribe for Sarina Ser, MD .  Documentation: I have reviewed the above documentation for accuracy and completeness, and I agree with the above.  Sarina Ser, MD

## 2022-03-13 ENCOUNTER — Encounter: Payer: Self-pay | Admitting: Dermatology

## 2022-04-04 ENCOUNTER — Other Ambulatory Visit: Payer: Self-pay | Admitting: Genetic Counselor

## 2022-04-04 ENCOUNTER — Inpatient Hospital Stay: Payer: Medicare Other | Attending: Oncology | Admitting: Genetic Counselor

## 2022-04-04 ENCOUNTER — Inpatient Hospital Stay: Payer: Medicare Other

## 2022-04-04 ENCOUNTER — Encounter: Payer: Self-pay | Admitting: Genetic Counselor

## 2022-04-04 DIAGNOSIS — Z8041 Family history of malignant neoplasm of ovary: Secondary | ICD-10-CM

## 2022-04-04 DIAGNOSIS — Z803 Family history of malignant neoplasm of breast: Secondary | ICD-10-CM | POA: Insufficient documentation

## 2022-04-04 DIAGNOSIS — Z800 Family history of malignant neoplasm of digestive organs: Secondary | ICD-10-CM

## 2022-04-04 DIAGNOSIS — Z801 Family history of malignant neoplasm of trachea, bronchus and lung: Secondary | ICD-10-CM

## 2022-04-04 DIAGNOSIS — Z860100 Personal history of colon polyps, unspecified: Secondary | ICD-10-CM

## 2022-04-04 HISTORY — DX: Family history of malignant neoplasm of breast: Z80.3

## 2022-04-04 HISTORY — DX: Family history of malignant neoplasm of digestive organs: Z80.0

## 2022-04-04 HISTORY — DX: Personal history of colon polyps, unspecified: Z86.0100

## 2022-04-04 LAB — GENETIC SCREENING ORDER

## 2022-04-04 NOTE — Progress Notes (Signed)
REFERRING PROVIDER: Ok Edwards, NP [address] [place],  [place] [address]  PRIMARY PROVIDER:  Baxter Hire, MD  PRIMARY REASON FOR VISIT:  1. History of colonic polyps   2. Family history of breast cancer   3. Family history of pancreatic cancer   4. Family history of ovarian cancer     HISTORY OF PRESENT ILLNESS:   Joe Tyler, a 81 y.o. male, was seen for a [place] cancer genetics consultation at the request of Dr. Jacqulyn Liner due to a personal history of Peutz-Jeghers polyp.  Joe Tyler presents to clinic today to discuss the possibility of a hereditary predisposition to cancer, to discuss genetic testing, and to further clarify his future cancer risks, as well as potential cancer risks for family members.   Joe Tyler is a 82 y.o. male with no personal history of cancer.  In 2008, colonoscopy revealed four polyps---one of which was a 77mm polyp in the splenic flexure with histological features consistent with a Peutez Jeghers polyp.  He also has a history of several hyperplastic polyps.  His most recent colonoscopy was in 2016, and he has a colonoscopy and upper endoscopy scheduled for the end of November.  He does not report any history of mucocutaneous freckling.   CANCER HISTORY:  Oncology History   No history exists.    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[place])    Shingles    Spinal stenosis     Past Surgical History:  Procedure Laterality Date   benign tumor removed Right arm   BILATERAL HIP ARTHROSCOPY     COLONOSCOPY WITH PROPOFOL N/A 04/07/2015   Procedure: COLONOSCOPY WITH PROPOFOL;  Surgeon: Manya Silvas,  MD;  Location: [facility];  Service: Endoscopy;  Laterality: N/A;   ESOPHAGOGASTRODUODENOSCOPY (EGD) WITH PROPOFOL N/A 04/07/2015   Procedure: ESOPHAGOGASTRODUODENOSCOPY (EGD) WITH PROPOFOL;  Surgeon: Manya Silvas, MD;  Location[facility] ENDOSCOPY;  Service: Endoscopy;  Laterality: N/A;   JOINT REPLACEMENT     LUMBAR LAMINECTOMY/DECOMPRESSION MICRODISCECTOMY N/A 06/26/2016   Procedure: LUMBAR LAMINECTOMY/DECOMPRESSION MICRODISCECTOMY 2 LEVELS L5-S1;  Surgeon: Meade Maw, MD;  Location: [facility];  Service: Neurosurgery;  Laterality: N/A;   TONSILLECTOMY         FAMILY HISTORY:  We obtained a detailed, 4-generation family history.  Significant diagnoses are listed below: Family History  Problem Relation Age of Onset   Pancreatic cancer Mother 72   Stomach cancer Maternal Aunt        d. 67   Cancer Maternal Uncle        x2 two mat uncles (identical twins), dx 56s   Lung cancer Paternal Uncle        d. 103s; smoking hx   Ovarian cancer Maternal Grandmother        dx 83s   Cancer Cousin        unknown type; maternal male cousin; dx after 79   Breast cancer Cousin        pat male cousin; dx before 30     Mr. Kyler is unaware of previous family history of genetic testing for hereditary  cancer risks. There is no reported Ashkenazi Jewish ancestry. There is no known consanguinity.  GENETIC COUNSELING ASSESSMENT: Joe Tyler is a 81 y.o. male with a family history which is somewhat suggestive of a hereditary cancer syndrome the presence of related cancers in the family and a personal history of Peutez Jegher polyp that warrants additional testing to rule out a diagnosis of Peutz Jeghers syndrome (PJS). We, therefore, discussed and recommended the following at today's visit.   DISCUSSION: We discussed that 5 - 10% cancer is hereditary.  We reviewed the cancer risks, management, and family implications of PJS.  We discussed that the presence of one PJ polyp does not meet  clinical diagnostic criteria for PJS.  We discussed that a STK11 gene mutation would be more suggestive of PJS and warrant considering colonoscopies every 2-3 years, pancreatic cancer screening, and other cancer screening.   There are other genes that can be associated with hereditary pancreatic, ovarian, and breast cancer syndromes including but not limited to BRCA1/2.  We discussed that testing is beneficial for several reasons, including knowing about other cancer risks and to understanding if other family members could be at risk for cancer and allowing them to undergo genetic testing.  We reviewed the characteristics, features and inheritance patterns of hereditary cancer syndromes. We also discussed genetic testing, including the appropriate family members to test, the process of testing, insurance coverage and turn-around-time for results. We discussed the implications of a negative, positive, carrier and/or variant of uncertain significant result. We discussed that negative results would be uninformative given that Joe Tyler does not have a personal history of cancer. We recommended Joe Tyler pursue genetic testing for a panel that contains STK11 and other genes associated with ovarian, pancreatic, and breast cancer syndromes.  The Multi-Cancer + RNA Panel offered by [facility] includes sequencing and/or deletion/duplication analysis of the following 84 genes:  AIP*, ALK, APC*, ATM*, AXIN2*, BAP1*, BARD1*, BLM*, BMPR1A*, BRCA1*, BRCA2*, BRIP1*, CASR, CDC73*, CDH1*, CDK4, CDKN1B*, CDKN1C*, CDKN2A, CEBPA, CHEK2*, CTNNA1*, DICER1*, DIS3L2*, EGFR, EPCAM, FH*, FLCN*, GATA2*, GPC3, GREM1, HOXB13, HRAS, KIT, MAX*, MEN1*, MET, MITF, MLH1*, MSH2*, MSH3*, MSH6*, MUTYH*, NBN*, NF1*, NF2*, NTHL1*, PALB2*, PDGFRA, PHOX2B, PMS2*, POLD1*, POLE*, POT1*, PRKAR1A*, PTCH1*, PTEN*, RAD50*, RAD51C*, RAD51D*, RB1*, RECQL4, RET, RUNX1*, SDHA*, SDHAF2*, SDHB*, SDHC*, SDHD*, SMAD4*, SMARCA4*, SMARCB1*, SMARCE1*, STK11*,  SUFU*, TERC, TERT, TMEM127*, Tp53*, TSC1*, TSC2*, VHL*, WRN*, and WT1.  RNA analysis is performed for * genes.  Based on Joe Tyler family history of cancer and personal history of PJ polyp, he meets medical criteria for genetic testing.   We discussed that some people do not want to undergo genetic testing due to fear of genetic discrimination.  A federal law called the Genetic Information Non-Discrimination Act (GINA) of 2008 helps protect individuals against genetic discrimination based on their genetic test results.  It impacts both health insurance and employment.  With health insurance, it protects against increased premiums, being kicked off insurance or being forced to take a test in order to be insured.  For employment it protects against hiring, firing and promoting decisions based on genetic test results.  GINA does not apply to those in the [employer], those who work for companies with less than 15 employees, and new life insurance or long-term disability insurance policies.  Health status due to a cancer diagnosis is not protected under GINA.  PLAN: After considering the risks, benefits, and limitations, Joe Tyler provided informed consent to pursue genetic testing and the blood sample was sent to  [facility] for analysis of the Multi-Cancer +RNA Panel. Results should be available within approximately 3 weeks' time, at which point they will be disclosed by telephone to Joe Tyler, as will any additional recommendations warranted by these results. Joe Tyler will receive a summary of his genetic counseling visit and a copy of his results once available. This information will also be available in Epic.   Joe Tyler questions were answered to his satisfaction today. Our contact information was provided should additional questions or concerns arise. Thank you for the referral and allowing [place] to share in the care of your patient.   Joe Tyler, [place], [facility] Genetic  Counselor [email or web address] (P) [phone]   The patient was seen for a total of 30 minutes in face-to-face genetic counseling.  The patient was seen alone.  Drs. Lindi Adie and/or Burr Medico were available to discuss this case as needed.  _______________________________________________________________________ For Office Staff:  Number of people involved in session: 1 Was an Intern/ student involved with case: yes; prospective GC student Rolla Etienne observed

## 2022-04-15 ENCOUNTER — Encounter (INDEPENDENT_AMBULATORY_CARE_PROVIDER_SITE_OTHER): Payer: Self-pay

## 2022-04-30 ENCOUNTER — Telehealth: Payer: Self-pay | Admitting: Genetic Counselor

## 2022-04-30 ENCOUNTER — Encounter: Payer: Self-pay | Admitting: Genetic Counselor

## 2022-04-30 DIAGNOSIS — Z1379 Encounter for other screening for genetic and chromosomal anomalies: Secondary | ICD-10-CM | POA: Insufficient documentation

## 2022-04-30 NOTE — Telephone Encounter (Signed)
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  Discussed that follow-up testing is necessary to clarify meaning of possible mosaic result for him and his family.   Of note, had hx of PJ polyp. Patient was negative for STK11.  Does not meet criteria for clinical diagnosis of PJS at this time.   Follow-up genetics appt scheduled for 11/16 at 3pm to further discuss results of genetic testing.

## 2022-05-02 ENCOUNTER — Encounter: Payer: Self-pay | Admitting: Genetic Counselor

## 2022-05-02 ENCOUNTER — Other Ambulatory Visit: Payer: Self-pay

## 2022-05-02 ENCOUNTER — Inpatient Hospital Stay: Payer: Medicare Other | Attending: Oncology | Admitting: Genetic Counselor

## 2022-05-13 ENCOUNTER — Ambulatory Visit: Payer: Medicare Other | Admitting: Anesthesiology

## 2022-05-13 ENCOUNTER — Encounter: Payer: Self-pay | Admitting: *Deleted

## 2022-05-13 ENCOUNTER — Encounter
Admission: RE | Disposition: A | Discharge status: Home / Self Care | Payer: Self-pay | Source: Home / Self Care | Attending: Gastroenterology

## 2022-05-13 ENCOUNTER — Ambulatory Visit
Admission: RE | Admit: 2022-05-13 | Discharge: 2022-05-13 | Disposition: A | Discharge status: Home / Self Care | Payer: Medicare Other | Attending: Gastroenterology | Admitting: Gastroenterology

## 2022-05-13 DIAGNOSIS — K219 Gastro-esophageal reflux disease without esophagitis: Secondary | ICD-10-CM | POA: Insufficient documentation

## 2022-05-13 DIAGNOSIS — Z800 Family history of malignant neoplasm of digestive organs: Secondary | ICD-10-CM | POA: Diagnosis not present

## 2022-05-13 DIAGNOSIS — M109 Gout, unspecified: Secondary | ICD-10-CM | POA: Diagnosis not present

## 2022-05-13 DIAGNOSIS — E119 Type 2 diabetes mellitus without complications: Secondary | ICD-10-CM | POA: Insufficient documentation

## 2022-05-13 DIAGNOSIS — K640 First degree hemorrhoids: Secondary | ICD-10-CM | POA: Insufficient documentation

## 2022-05-13 DIAGNOSIS — F410 Panic disorder [episodic paroxysmal anxiety] without agoraphobia: Secondary | ICD-10-CM | POA: Insufficient documentation

## 2022-05-13 DIAGNOSIS — Z86010 Personal history of colonic polyps: Secondary | ICD-10-CM | POA: Insufficient documentation

## 2022-05-13 DIAGNOSIS — K5730 Diverticulosis of large intestine without perforation or abscess without bleeding: Secondary | ICD-10-CM | POA: Insufficient documentation

## 2022-05-13 DIAGNOSIS — Z1211 Encounter for screening for malignant neoplasm of colon: Secondary | ICD-10-CM | POA: Diagnosis not present

## 2022-05-13 DIAGNOSIS — D123 Benign neoplasm of transverse colon: Secondary | ICD-10-CM | POA: Insufficient documentation

## 2022-05-13 DIAGNOSIS — Z87891 Personal history of nicotine dependence: Secondary | ICD-10-CM | POA: Insufficient documentation

## 2022-05-13 HISTORY — PX: ESOPHAGOGASTRODUODENOSCOPY (EGD) WITH PROPOFOL: SHX5813

## 2022-05-13 HISTORY — PX: COLONOSCOPY WITH PROPOFOL: SHX5780

## 2022-05-13 LAB — GLUCOSE, CAPILLARY: Glucose-Capillary: 97 mg/dL (ref 70–99)

## 2022-05-13 SURGERY — COLONOSCOPY WITH PROPOFOL
Anesthesia: General

## 2022-05-13 MED ORDER — PROPOFOL 10 MG/ML IV BOLUS
INTRAVENOUS | Status: DC | PRN
  Administered 2022-05-13: 20 mg via INTRAVENOUS
  Administered 2022-05-13: 90 mg via INTRAVENOUS

## 2022-05-13 MED ORDER — PROPOFOL 1000 MG/100ML IV EMUL
INTRAVENOUS | Status: AC
  Filled 2022-05-13: qty 200

## 2022-05-13 MED ORDER — PHENYLEPHRINE HCL (PRESSORS) 10 MG/ML IV SOLN
INTRAVENOUS | Status: DC | PRN
  Administered 2022-05-13 (×3): 160 ug via INTRAVENOUS

## 2022-05-13 MED ORDER — LIDOCAINE HCL (CARDIAC) PF 100 MG/5ML IV SOSY
PREFILLED_SYRINGE | INTRAVENOUS | Status: DC | PRN
  Administered 2022-05-13: 100 mg via INTRAVENOUS

## 2022-05-13 MED ORDER — EPHEDRINE SULFATE (PRESSORS) 50 MG/ML IJ SOLN
INTRAMUSCULAR | Status: DC | PRN
  Administered 2022-05-13: 5 mg via INTRAVENOUS

## 2022-05-13 MED ORDER — PROPOFOL 500 MG/50ML IV EMUL
INTRAVENOUS | Status: DC | PRN
  Administered 2022-05-13: 150 ug/kg/min via INTRAVENOUS

## 2022-05-13 MED ORDER — SODIUM CHLORIDE 0.9 % IV SOLN: INTRAVENOUS | Status: DC

## 2022-05-13 MED ORDER — SIMETHICONE 40 MG/0.6ML PO SUSP
ORAL | Status: DC | PRN
  Administered 2022-05-13: 60 mL

## 2022-05-13 NOTE — Interval H&P Note (Signed)
History and Physical Interval Note:  05/13/2022 9:12 AM  Joe Tyler  has presented today for surgery, with the diagnosis of GERD,HX OF COLON POLYPS.  The various methods of treatment have been discussed with the patient and family. After consideration of risks, benefits and other options for treatment, the patient has consented to  Procedure(s): COLONOSCOPY WITH PROPOFOL (N/A) ESOPHAGOGASTRODUODENOSCOPY (EGD) WITH PROPOFOL (N/A) as a surgical intervention.  The patient's history has been reviewed, patient examined, no change in status, stable for surgery.  I have reviewed the patient's chart and labs.  Questions were answered to the patient's satisfaction.     Lesly Rubenstein  Ok to proceed with EGD/Colonoscopy

## 2022-05-13 NOTE — H&P (Signed)
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 No blood thinners. No family history of GI malignancies except mother with pancreatic cancer. No significant abdominal surgeries.    Current Facility-Administered Medications:    0.9 %  sodium chloride infusion, , Intravenous, Continuous, Jerimie Mancuso, Hilton Cork, MD, Last Rate: 20 mL/hr at 05/13/22 0824, New Bag at 05/13/22 0824  Medications Prior to Admission  Medication Sig Dispense Refill Last Dose   allopurinol (ZYLOPRIM) 300 MG tablet Take 300 mg by mouth daily.   05/12/2022   ALPRAZolam (XANAX) 0.25 MG tablet Take by mouth as needed.   Past Week   Multiple Vitamins-Minerals (ICAPS AREDS 2 PO) Take 2 capsules by mouth daily.   Past Week   terbinafine (LAMISIL) 250 MG tablet Take 1 tablet (250 mg total) by mouth daily. 30 tablet 0 Past Week   gabapentin (NEURONTIN) 100 MG capsule Take 200 mg by mouth 3 (three) times daily. (Patient not taking: Reported on 05/13/2022)   Not Taking   methylPREDNISolone (MEDROL DOSEPAK) 4 MG TBPK tablet Take by mouth daily. 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    Physical Exam  Gen: Alert, oriented. Appears stated age.  HEENT: PERRLA. Lungs: No respiratory distress CV: RRR Abd: soft, benign, no masses Ext: No edema    Planned procedures: Proceed with EGD/colonoscopy. The patient understands the nature of the planned procedure, indications, risks, alternatives and potential complications including but not limited to bleeding, infection, perforation, damage to internal organs and possible oversedation/side effects from anesthesia. The patient agrees and gives consent to proceed.  Please refer to procedure notes for findings, recommendations and patient disposition/instructions.     Lesly Rubenstein, MD [facility] Gastroenterology

## 2022-05-13 NOTE — Anesthesia Preprocedure Evaluation (Addendum)
Anesthesia Evaluation  Patient identified by MRN, date of birth, ID band Patient awake    Reviewed: Allergy & Precautions, NPO status , Patient's Chart, lab work & pertinent test results  Airway Mallampati: III  TM Distance: <3 FB Neck ROM: full    Dental  (+) Chipped   Pulmonary neg shortness of breath, former smoker   Pulmonary exam normal        Cardiovascular Exercise Tolerance: Good (-) angina negative cardio ROS Normal cardiovascular exam     Neuro/Psych   Anxiety     negative neurological ROS  negative psych ROS   GI/Hepatic negative GI ROS, Neg liver ROS,neg GERD  ,,  Endo/Other  diabetes, Type 2    Renal/GU negative Renal ROS  negative genitourinary   Musculoskeletal   Abdominal   Peds  Hematology negative hematology ROS (+)   Anesthesia Other Findings Patient reports that they do not think that any food or pills are stuck in their throat at this time.  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[place]) No date: Shingles No date: Spinal stenosis  Past Surgical History: arm: benign tumor removed; Right No date: BILATERAL HIP ARTHROSCOPY 04/07/2015: COLONOSCOPY WITH PROPOFOL; N/A     Comment:  Procedure: COLONOSCOPY WITH PROPOFOL;  Surgeon: Manya Silvas, MD;  Location: [facility];  Service:               Endoscopy;  Laterality: N/A; 04/07/2015: ESOPHAGOGASTRODUODENOSCOPY (EGD) WITH PROPOFOL; N/A     Comment:  Procedure: ESOPHAGOGASTRODUODENOSCOPY (EGD) WITH               PROPOFOL;  Surgeon: Manya Silvas, MD;  Location: [facility]               ENDOSCOPY;  Service: Endoscopy;  Laterality: N/A; No date: JOINT REPLACEMENT 06/26/2016: LUMBAR LAMINECTOMY/DECOMPRESSION MICRODISCECTOMY; N/A     Comment:  Procedure: LUMBAR LAMINECTOMY/DECOMPRESSION               MICRODISCECTOMY 2 LEVELS L5-S1;  Surgeon: Meade Maw, MD;  Location: [facility];  Service:               Neurosurgery;  Laterality: N/A; No date: TONSILLECTOMY  BMI    Body Mass Index: 27.62 kg/m      Reproductive/Obstetrics negative OB ROS                             Anesthesia Physical Anesthesia Plan  ASA: 3  Anesthesia Plan: General   Post-op Pain Management:    Induction: Intravenous  PONV Risk Score and Plan: Propofol infusion and TIVA  Airway Management Planned: Natural Airway and Nasal Cannula  Additional Equipment:   Intra-op Plan:   Post-operative Plan:   Informed Consent: I have reviewed the patients History and Physical, chart, labs and discussed the procedure including the risks, benefits and alternatives for the proposed anesthesia with the patient or authorized representative who has indicated his/her understanding and acceptance.  Dental Advisory Given  Plan Discussed with: Anesthesiologist, CRNA and Surgeon  Anesthesia Plan Comments: (Patient consented for risks of anesthesia including but not limited to:  - adverse reactions to medications - risk of airway placement if required - damage to eyes, teeth, lips or other oral mucosa - nerve damage due to positioning  - sore throat or hoarseness - Damage to heart, brain, nerves, lungs, other parts of body or loss of life  Patient voiced understanding.)       Anesthesia Quick Evaluation

## 2022-05-13 NOTE — Anesthesia Postprocedure Evaluation (Signed)
Anesthesia Post Note  Patient: Joe Tyler  Procedure(s) Performed: COLONOSCOPY WITH PROPOFOL ESOPHAGOGASTRODUODENOSCOPY (EGD) WITH PROPOFOL  Patient location during evaluation: Endoscopy Anesthesia Type: General Level of consciousness: awake and alert Pain management: pain level controlled Vital Signs Assessment: post-procedure vital signs reviewed and stable Respiratory status: spontaneous breathing, nonlabored ventilation, respiratory function stable and patient connected to nasal cannula oxygen Cardiovascular status: blood pressure returned to baseline and stable Postop Assessment: no apparent nausea or vomiting Anesthetic complications: no   No notable events documented.   Last Vitals:  Vitals:   05/13/22 0950 05/13/22 1004  BP:  109/64  Pulse:    Resp:    Temp: (!) 36 C   SpO2:      Last Pain:  Vitals:   05/13/22 1017  TempSrc:   PainSc: 0-No pain                 Precious Haws Beretta Ginsberg

## 2022-05-13 NOTE — Op Note (Signed)
[facility] Gastroenterology Patient Name: Joe Tyler Procedure Date: 05/13/2022 9:01 AM MRN: [id number] Account #: [id number] Date of Birth: 1941/02/26 Admit Type: Outpatient Age: 81 Room: [facility] ENDO ROOM 3 Gender: Male Note Status: Finalized Instrument Name: Jasper Riling [id number] Procedure:             Colonoscopy Indications:           Surveillance: Personal history of adenomatous polyps                         on last colonoscopy > 5 years ago Providers:             Andrey Farmer MD, MD Medicines:             Monitored Anesthesia Care Complications:         No immediate complications. Estimated blood loss:                         Minimal. Procedure:             Pre-Anesthesia Assessment:                        - Prior to the procedure, a History and Physical was                         performed, and patient medications and allergies were                         reviewed. The patient is competent. The risks and                         benefits of the procedure and the sedation options and                         risks were discussed with the patient. All questions                         were answered and informed consent was obtained.                         Patient identification and proposed procedure were                         verified by the physician, the nurse, the                         anesthesiologist, the anesthetist and the technician                         in the endoscopy suite. Mental Status Examination:                         alert and oriented. Airway Examination: normal                         oropharyngeal airway and neck mobility. Respiratory                         Examination: clear to auscultation. CV Examination:  normal. Prophylactic Antibiotics: The patient does not                         require prophylactic antibiotics. Prior                         Anticoagulants: The patient has taken no  anticoagulant                         or antiplatelet agents. ASA Grade Assessment: III - A                         patient with severe systemic disease. After reviewing                         the risks and benefits, the patient was deemed in                         satisfactory condition to undergo the procedure. The                         anesthesia plan was to use monitored anesthesia care                         (MAC). Immediately prior to administration of                         medications, the patient was re-assessed for adequacy                         to receive sedatives. The heart rate, respiratory                         rate, oxygen saturations, blood pressure, adequacy of                         pulmonary ventilation, and response to care were                         monitored throughout the procedure. The physical                         status of the patient was re-assessed after the                         procedure.                        After obtaining informed consent, the colonoscope was                         passed under direct vision. Throughout the procedure,                         the patient's blood pressure, pulse, and oxygen                         saturations were monitored continuously. The  Colonoscope was introduced through the anus and                         advanced to the the cecum, identified by appendiceal                         orifice and ileocecal valve. The colonoscopy was                         somewhat difficult due to a redundant colon. The                         patient tolerated the procedure well. The quality of                         the bowel preparation was adequate to identify polyps.                         The ileocecal valve, appendiceal orifice, and rectum                         were photographed. Findings:      The perianal and digital rectal examinations were normal.      A single large-mouthed  diverticulum was found in the ascending colon.      A 4 mm polyp was found in the hepatic flexure. The polyp was sessile.       The polyp was removed with a cold snare. Resection and retrieval were       complete. Estimated blood loss was minimal.      Internal hemorrhoids were found during retroflexion. The hemorrhoids       were Grade I (internal hemorrhoids that do not prolapse).      The exam was otherwise without abnormality on direct and retroflexion       views. Impression:            - Diverticulosis in the ascending colon.                        - One 4 mm polyp at the hepatic flexure, removed with                         a cold snare. Resected and retrieved.                        - Internal hemorrhoids.                        - The examination was otherwise normal on direct and                         retroflexion views. Recommendation:        - Discharge patient to home.                        - Resume previous diet.                        - Continue present medications.                        -  Await pathology results.                        - Repeat colonoscopy is not recommended due to current                         age (40 years or older) for surveillance.                        - Return to referring physician as previously                         scheduled. Procedure Code(s):     --- Professional ---                        [phone], Colonoscopy, flexible; with removal of                         tumor(s), polyp(s), or other lesion(s) by snare                         technique Diagnosis Code(s):     --- Professional ---                        Z86.010, Personal history of colonic polyps                        K64.0, First degree hemorrhoids                        D12.3, Benign neoplasm of transverse colon (hepatic                         flexure or splenic flexure)                        K57.30, Diverticulosis of large intestine without                         perforation  or abscess without bleeding CPT copyright 2022 American Medical Association. All rights reserved. The codes documented in this report are preliminary and upon coder review may  be revised to meet current compliance requirements. Andrey Farmer MD, MD 05/13/2022 9:56:02 AM Number of Addenda: 0 Note Initiated On: 05/13/2022 9:01 AM Scope Withdrawal Time: 0 hours 7 minutes 57 seconds  Total Procedure Duration: 0 hours 15 minutes 12 seconds  Estimated Blood Loss:  Estimated blood loss was minimal.      [facility]

## 2022-05-13 NOTE — Op Note (Signed)
[facility] Gastroenterology Patient Name: Joe Tyler Procedure Date: 05/13/2022 9:05 AM MRN: [id number] Account #: [id number] Date of Birth: 24-Jun-1940 Admit Type: Outpatient Age: 81 Room: [facility] ENDO ROOM 3 Gender: Male Note Status: Finalized Instrument Name: Upper Endoscope [id number] Procedure:             Upper GI endoscopy Indications:           Gastro-esophageal reflux disease Providers:             Andrey Farmer MD, MD Medicines:             Monitored Anesthesia Care Complications:         No immediate complications. Estimated blood loss:                         Minimal. Procedure:             Pre-Anesthesia Assessment:                        - Prior to the procedure, a History and Physical was                         performed, and patient medications and allergies were                         reviewed. The patient is competent. The risks and                         benefits of the procedure and the sedation options and                         risks were discussed with the patient. All questions                         were answered and informed consent was obtained.                         Patient identification and proposed procedure were                         verified by the physician, the nurse, the                         anesthesiologist, the anesthetist and the technician                         in the endoscopy suite. Mental Status Examination:                         alert and oriented. Airway Examination: normal                         oropharyngeal airway and neck mobility. Respiratory                         Examination: clear to auscultation. CV Examination:                         normal. Prophylactic Antibiotics: The patient does not  require prophylactic antibiotics. Prior                         Anticoagulants: The patient has taken no anticoagulant                         or antiplatelet agents. ASA Grade  Assessment: III - A                         patient with severe systemic disease. After reviewing                         the risks and benefits, the patient was deemed in                         satisfactory condition to undergo the procedure. The                         anesthesia plan was to use monitored anesthesia care                         (MAC). Immediately prior to administration of                         medications, the patient was re-assessed for adequacy                         to receive sedatives. The heart rate, respiratory                         rate, oxygen saturations, blood pressure, adequacy of                         pulmonary ventilation, and response to care were                         monitored throughout the procedure. The physical                         status of the patient was re-assessed after the                         procedure.                        After obtaining informed consent, the endoscope was                         passed under direct vision. Throughout the procedure,                         the patient's blood pressure, pulse, and oxygen                         saturations were monitored continuously. The Endoscope                         was introduced through the mouth, and advanced to the  second part of duodenum. The upper GI endoscopy was                         accomplished without difficulty. The patient tolerated                         the procedure well. Findings:      The examined esophagus was normal.      The entire examined stomach was normal. The stomach appeared to be       j-shaped. Biopsies were taken with a cold forceps for Helicobacter       pylori testing. Estimated blood loss was minimal.      The examined duodenum was normal. Impression:            - Normal esophagus.                        - Normal stomach. Biopsied. Could consider upper                         imaging to rule out large  hiatal hernia as the stomach                         was j-shaped.                        - Normal examined duodenum. Recommendation:        - Discharge patient to home.                        - Resume previous diet.                        - Continue present medications.                        - Await pathology results.                        - Return to referring physician as previously                         scheduled. Procedure Code(s):     --- Professional ---                        [phone], Esophagogastroduodenoscopy, flexible,                         transoral; with biopsy, single or multiple Diagnosis Code(s):     --- Professional ---                        K21.9, Gastro-esophageal reflux disease without                         esophagitis CPT copyright 2022 American Medical Association. All rights reserved. The codes documented in this report are preliminary and upon coder review may  be revised to meet current compliance requirements. Andrey Farmer MD, MD 05/13/2022 9:52:53 AM Number of Addenda: 0 Note Initiated On: 05/13/2022 9:05 AM Estimated Blood Loss:  Estimated blood loss was minimal.  [facility]

## 2022-05-13 NOTE — Transfer of Care (Signed)
Immediate Anesthesia Transfer of Care Note  Patient: Joe Tyler  Procedure(s) Performed: COLONOSCOPY WITH PROPOFOL ESOPHAGOGASTRODUODENOSCOPY (EGD) WITH PROPOFOL  Patient Location: PACU and Endoscopy Unit  Anesthesia Type:General  Level of Consciousness: drowsy  Airway & Oxygen Therapy: Patient Spontanous Breathing  Post-op Assessment: Report given to RN and Post -op Vital signs reviewed and stable  Post vital signs: Reviewed and stable  Last Vitals:  Vitals Value Taken Time  BP 91/51 05/13/22 0950  Temp    Pulse 88 05/13/22 0950  Resp 20 05/13/22 0950  SpO2 97 % 05/13/22 0950  Vitals shown include unvalidated device data.  Last Pain:  Vitals:   05/13/22 0807  TempSrc: Temporal         Complications: No notable events documented.

## 2022-05-14 ENCOUNTER — Encounter: Payer: Self-pay | Admitting: Gastroenterology

## 2022-05-14 LAB — SURGICAL PATHOLOGY

## 2022-09-12 ENCOUNTER — Ambulatory Visit (INDEPENDENT_AMBULATORY_CARE_PROVIDER_SITE_OTHER): Payer: Medicare Other | Admitting: Dermatology

## 2022-09-12 VITALS — BP 139/77 | HR 87

## 2022-09-12 DIAGNOSIS — Z5111 Encounter for antineoplastic chemotherapy: Secondary | ICD-10-CM | POA: Diagnosis not present

## 2022-09-12 DIAGNOSIS — Z79899 Other long term (current) drug therapy: Secondary | ICD-10-CM | POA: Diagnosis not present

## 2022-09-12 DIAGNOSIS — L578 Other skin changes due to chronic exposure to nonionizing radiation: Secondary | ICD-10-CM

## 2022-09-12 DIAGNOSIS — L570 Actinic keratosis: Secondary | ICD-10-CM

## 2022-09-12 DIAGNOSIS — D229 Melanocytic nevi, unspecified: Secondary | ICD-10-CM

## 2022-09-12 DIAGNOSIS — L820 Inflamed seborrheic keratosis: Secondary | ICD-10-CM

## 2022-09-12 DIAGNOSIS — Z1283 Encounter for screening for malignant neoplasm of skin: Secondary | ICD-10-CM

## 2022-09-12 DIAGNOSIS — L814 Other melanin hyperpigmentation: Secondary | ICD-10-CM

## 2022-09-12 DIAGNOSIS — L821 Other seborrheic keratosis: Secondary | ICD-10-CM

## 2022-09-12 NOTE — Progress Notes (Deleted)
Follow-Up Visit   Subjective  Joe Tyler is a 82 y.o. male who presents for the following: Ak f/u, 5m, scalp, used 5FU/Calcipotriene cream scalp x 7 days with good reaction, ears, ISK f/u 68m, trunk, check sore spot, L preauricular, few months, tender, check blackheads post neck,   The following portions of the chart were reviewed this encounter and updated as appropriate: medications, allergies, medical history  Review of Systems:  No other skin or systemic complaints except as noted in HPI or Assessment and Plan.  Objective  Well appearing patient in no apparent distress; mood and affect are within normal limits.   A focused examination was performed of the following areas: Face, scalp, trunk  Relevant exam findings are noted in the Assessment and Plan.    Assessment & Plan   ACTINIC KERATOSIS Exam: Erythematous thin papules/macules with gritty scale  Actinic keratoses are precancerous spots that appear secondary to cumulative UV radiation exposure/sun exposure over time. They are chronic with expected duration over 1 year. A portion of actinic keratoses will progress to squamous cell carcinoma of the skin. It is not possible to reliably predict which spots will progress to skin cancer and so treatment is recommended to prevent development of skin cancer.  Recommend daily broad spectrum sunscreen SPF 30+ to sun-exposed areas, reapply every 2 hours as needed.  Recommend staying in the shade or wearing long sleeves, sun glasses (UVA+UVB protection) and wide brim hats (4-inch brim around the entire circumference of the hat). Call for new or changing lesions.  Treatment Plan:  Prior to procedure, discussed risks of blister formation, small wound, skin dyspigmentation, or rare scar following cryotherapy. Recommend Vaseline ointment to treated areas while healing.  Destruction Procedure Note Destruction method: cryotherapy   Informed consent: discussed and consent obtained    Lesion destroyed using liquid nitrogen: Yes   Outcome: patient tolerated procedure well with no complications   Post-procedure details: wound care instructions given   Locations: face, scalp, arms # of Lesions Treated: 25   INFLAMED SEBORRHEIC KERATOSIS Exam: Erythematous keratotic or waxy stuck-on papule or plaque.  Symptomatic, irritating, patient would like treated.  Benign-appearing.  Call clinic for new or changing lesions.   Prior to procedure, discussed risks of blister formation, small wound, skin dyspigmentation, or rare scar following treatment. Recommend Vaseline ointment to treated areas while healing.  Destruction Procedure Note Destruction method: cryotherapy   Informed consent: discussed and consent obtained   Lesion destroyed using liquid nitrogen: Yes   Outcome: patient tolerated procedure well with no complications   Post-procedure details: wound care instructions given   Locations: L preauricular x 1, arms, trunk # of Lesions Treated: 18   LENTIGINES, SEBORRHEIC KERATOSES, HEMANGIOMAS - Benign normal skin lesions - Benign-appearing - Call for any changes  MELANOCYTIC NEVI - Tan-brown and/or pink-flesh-colored symmetric macules and papules - Benign appearing on exam today - Observation - Call clinic for new or changing moles - Recommend daily use of broad spectrum spf 30+ sunscreen to sun-exposed areas.   ACTINIC DAMAGE WITH PRECANCEROUS ACTINIC KERATOSES Counseling for Topical Chemotherapy Management: Patient exhibits: - Severe, confluent actinic changes with pre-cancerous actinic keratoses that is secondary to cumulative UV radiation exposure over time - Condition that is severe; chronic, not at goal. - diffuse scaly erythematous macules and papules with underlying dyspigmentation - Discussed Prescription "Field Treatment" topical Chemotherapy for Severe, Chronic Confluent Actinic Changes with Pre-Cancerous Actinic Keratoses Field treatment involves  treatment of an entire area of skin that has  confluent Actinic Changes (Sun/ Ultraviolet light damage) and PreCancerous Actinic Keratoses by method of PhotoDynamic Therapy (PDT) and/or prescription Topical Chemotherapy agents such as 5-fluorouracil, 5-fluorouracil/calcipotriene, and/or imiquimod.  The purpose is to decrease the number of clinically evident and subclinical PreCancerous lesions to prevent progression to development of skin cancer by chemically destroying early precancer changes that may or may not be visible.  It has been shown to reduce the risk of developing skin cancer in the treated area. As a result of treatment, redness, scaling, crusting, and open sores may occur during treatment course. One or more than one of these methods may be used and may have to be used several times to control, suppress and eliminate the PreCancerous changes. Discussed treatment course, expected reaction, and possible side effects. - Recommend daily broad spectrum sunscreen SPF 30+ to sun-exposed areas, reapply every 2 hours as needed.  - Staying in the shade or wearing long sleeves, sun glasses (UVA+UVB protection) and wide brim hats (4-inch brim around the entire circumference of the hat) are also recommended. - Call for new or changing lesions.  - In 1 month Start 5-fluorouracil/calcipotriene cream twice a day for 7 days to affected areas including scalp. Prescription originally sent to [place]. Patient provided with handout reviewing treatment course and side effects and advised to call or message [place] on MyChart with any concerns.  Reviewed course of treatment and expected reaction.  Patient advised to expect inflammation and crusting and advised that erosions are possible.  Patient advised to be diligent with sun protection during and after treatment. Counseled to keep medication out of reach of children and pets.   SKIN CANCER SCREENING PERFORMED TODAY   EPIDERMAL CYST / OPEN  COMEDONE Posterior neck Exam: Subcutaneous nodule 1.2 x 0.5cm  Benign-appearing. Exam most consistent with an epidermal inclusion cyst. Discussed that a cyst is a benign growth that can grow over time and sometimes get irritated or inflamed. Recommend observation if it is not bothersome. Discussed option of surgical excision to remove it if it is growing, symptomatic, or other changes noted. Please call for new or changing lesions so they can be evaluated.     Return in about 6 months (around 03/15/2023) for Hx of AKs.  I, Othelia Pulling, RMA, am acting as scribe for Sarina Ser, MD .   Documentation: I have reviewed the above documentation for accuracy and completeness, and I agree with the above.  Sarina Ser, MD

## 2022-09-12 NOTE — Patient Instructions (Addendum)
- In 1 month Start 5-fluorouracil/calcipotriene cream twice a day for 7 days to affected areas including scalp. Prescription originally sent to [facility].   Reviewed course of treatment and expected reaction.  Patient advised to expect inflammation and crusting and advised that erosions are possible.  Patient advised to be diligent with sun protection during and after treatment. Counseled to keep medication out of reach of children and pets.   5-Fluorouracil/Calcipotriene Patient Education   Actinic keratoses are the dry, red scaly spots on the skin caused by sun damage. A portion of these spots can turn into skin cancer with time, and treating them can help prevent development of skin cancer.   Treatment of these spots requires removal of the defective skin cells. There are various ways to remove actinic keratoses, including freezing with liquid nitrogen, treatment with creams, or treatment with a blue light procedure in the office.   5-fluorouracil cream is a topical cream used to treat actinic keratoses. It works by interfering with the growth of abnormal fast-growing skin cells, such as actinic keratoses. These cells peel off and are replaced by healthy ones.   5-fluorouracil/calcipotriene is a combination of the 5-fluorouracil cream with a vitamin D analog cream called calcipotriene. The calcipotriene alone does not treat actinic keratoses. However, when it is combined with 5-fluorouracil, it helps the 5-fluorouracil treat the actinic keratoses much faster so that the same results can be achieved with a much shorter treatment time.  INSTRUCTIONS FOR 5-FLUOROURACIL/CALCIPOTRIENE CREAM:   5-fluorouracil/calcipotriene cream typically only needs to be used for 4-7 days. A thin layer should be applied twice a day to the treatment areas recommended by your physician.   If your physician prescribed you separate tubes of 5-fluourouracil and calcipotriene, apply a thin layer  of 5-fluorouracil followed by a thin layer of calcipotriene.   Avoid contact with your eyes, nostrils, and mouth. Do not use 5-fluorouracil/calcipotriene cream on infected or open wounds.   You will develop redness, irritation and some crusting at areas where you have pre-cancer damage/actinic keratoses. IF YOU DEVELOP PAIN, BLEEDING, OR SIGNIFICANT CRUSTING, STOP THE TREATMENT EARLY - you have already gotten a good response and the actinic keratoses should clear up well.  Wash your hands after applying 5-fluorouracil 5% cream on your skin.   A moisturizer or sunscreen with a minimum SPF 30 should be applied each morning.   Once you have finished the treatment, you can apply a thin layer of Vaseline twice a day to irritated areas to soothe and calm the areas more quickly. If you experience significant discomfort, contact your physician.  For some patients it is necessary to repeat the treatment for best results.  SIDE EFFECTS: When using 5-fluorouracil/calcipotriene cream, you may have mild irritation, such as redness, dryness, swelling, or a mild burning sensation. This usually resolves within 2 weeks. The more actinic keratoses you have, the more redness and inflammation you can expect during treatment. Eye irritation has been reported rarely. If this occurs, please let us know.  If you have any trouble using this cream, please call the office. If you have any other questions about this information, please do not hesitate to ask me before you leave the office.   Cryotherapy Aftercare  Wash gently with soap and water everyday.   Apply Vaseline and Band-Aid daily until healed.   Due to recent changes in healthcare laws, you may see results of your pathology and/or laboratory studies on MyChart before the doctors have had a chance  to review them. We understand that in some cases there may be results that are confusing or concerning to you. Please understand that not all results are received at  the same time and often the doctors may need to interpret multiple results in order to provide you with the best plan of care or course of treatment. Therefore, we ask that you please give [place] 2 business days to thoroughly review all your results before contacting the office for clarification. Should we see a critical lab result, you will be contacted sooner.   If You Need Anything After Your Visit  If you have any questions or concerns for your doctor, please call our main line at [phone] and press option 4 to reach your doctor's medical assistant. If no one answers, please leave a voicemail as directed and we will return your call as soon as possible. Messages left after 4 pm will be answered the following business day.   You may also send [place] a message via [place]. We typically respond to MyChart messages within 1-2 business days.  For prescription refills, please ask your pharmacy to contact our office. Our fax number is [phone].  If you have an urgent issue when the clinic is closed that cannot wait until the next business day, you can page your doctor at the number below.    Please note that while we do our best to be available for urgent issues outside of office hours, we are not available 24/7.   If you have an urgent issue and are unable to reach [place], you may choose to seek medical care at your doctor's office, retail clinic, urgent care center, or emergency room.  If you have a medical emergency, please immediately call 911 or go to the emergency department.  Pager Numbers  - Dr. Nehemiah Massed: [phone]  - Dr. Laurence Ferrari: [phone]  - Dr. Nicole Kindred: [phone]  In the event of inclement weather, please call our main line at [phone] for an update on the status of any delays or closures.  Dermatology Medication Tips: Please keep the boxes that topical medications come in in order to help keep track of the instructions about where and how to use these. Pharmacies  typically print the medication instructions only on the boxes and not directly on the medication tubes.   If your medication is too expensive, please contact our office at [phone] option 4 or send [place] a message through [place].   We are unable to tell what your co-pay for medications will be in advance as this is different depending on your insurance coverage. However, we may be able to find a substitute medication at lower cost or fill out paperwork to get insurance to cover a needed medication.   If a prior authorization is required to get your medication covered by your insurance company, please allow [place] 1-2 business days to complete this process.  Drug prices often vary depending on where the prescription is filled and some pharmacies may offer cheaper prices.  The website www.goodrx.com contains coupons for medications through different pharmacies. The prices here do not account for what the cost may be with help from insurance (it may be cheaper with your insurance), but the website can give you the price if you did not use any insurance.  - You can print the associated coupon and take it with your prescription to the pharmacy.  - You may also stop by our office during regular business hours and pick up a GoodRx coupon card.  -  If you need your prescription sent electronically to a different pharmacy, notify our office through [facility] or by phone at [phone] option 4.     Si Usted Necesita Algo Despus de Su Visita  Tambin puede enviarnos un mensaje a travs de Pharmacist, community. Por lo general respondemos a los mensajes de MyChart en el transcurso de 1 a 2 das hbiles.  Para renovar recetas, por favor pida a su farmacia que se ponga en contacto con nuestra oficina. Harland Dingwall de fax es [place] [phone].  Si tiene un asunto urgente cuando la clnica est cerrada y que no puede esperar hasta el siguiente da hbil, puede llamar/localizar a su doctor(a) al nmero que  aparece a continuacin.   Por favor, tenga en cuenta que aunque hacemos todo lo posible para estar disponibles para asuntos urgentes fuera del horario de [place], no estamos disponibles las 24 horas del da, los 7 das de la [place].   Si tiene un problema urgente y no puede comunicarse con nosotros, puede optar por buscar atencin mdica  en el consultorio de su doctor(a), en una clnica privada, en un centro de atencin urgente o en una sala de emergencias.  Si tiene Engineering geologist, por favor llame inmediatamente al 911 o vaya a la sala de emergencias.  Nmeros de bper  - Dr. Nehemiah Massed: [phone]  - Dra. Moye: [phone]  - Dra. Nicole Kindred: [phone]  En caso de inclemencias del [place], por favor llame a Johnsie Kindred principal al [phone] para una actualizacin sobre el [place] de cualquier retraso o cierre.  Consejos para la medicacin en dermatologa: Por favor, guarde las cajas en las que vienen los medicamentos de uso tpico para ayudarle a seguir las instrucciones sobre dnde y cmo usarlos. Las farmacias generalmente imprimen las instrucciones del medicamento slo en las cajas y no directamente en los tubos del [place].   Si su medicamento es muy caro, por favor, pngase en contacto con Zigmund Daniel llamando al [phone] y presione la opcin 4 o envenos un mensaje a travs de Pharmacist, community.   No podemos decirle cul ser su copago por los medicamentos por adelantado ya que esto es diferente dependiendo de la cobertura de su seguro. Sin embargo, es posible que podamos encontrar un medicamento sustituto a Electrical engineer un formulario para que el seguro cubra el medicamento que se considera necesario.   Si se requiere una autorizacin previa para que su compaa de seguros [place] su medicamento, por favor permtanos de 1 a 2 das hbiles para completar este proceso.  Los precios de los medicamentos varan con frecuencia dependiendo del Environmental consultant de dnde se surte  la receta y alguna farmacias pueden ofrecer precios ms baratos.  El sitio web www.goodrx.com tiene cupones para medicamentos de Airline pilot. Los precios aqu no tienen en cuenta lo que podra costar con la ayuda del seguro (puede ser ms barato con su seguro), pero el sitio web puede darle el precio si no utiliz Research scientist (physical sciences).  - Puede imprimir el cupn correspondiente y llevarlo con su receta a la farmacia.  - Tambin puede pasar por nuestra oficina durante el horario de atencin regular y Charity fundraiser una tarjeta de cupones de GoodRx.  - Si necesita que su receta se enve electrnicamente a una farmacia diferente, informe a nuestra oficina a travs de MyChart de [place] o por telfono llamando al [phone] y presione la opcin 4.

## 2022-09-12 NOTE — Progress Notes (Signed)
Follow-Up Visit   Subjective  Joe Tyler is a 82 y.o. male who presents for the following: Skin Cancer Screening and Full Body Skin Exam Ak f/u, 72m, scalp, used 5FU/Calcipotriene cream scalp x 7 days with good reaction, ears, ISK f/u 39m, trunk, check sore spot, L preauricular, few months, tender, check blackheads post neck,  The patient presents for Upper Body Skin Exam (UBSE) for skin cancer screening and mole check. The patient has spots, moles and lesions to be evaluated, some may be new or changing and the patient has concerns that these could be cancer.  The following portions of the chart were reviewed this encounter and updated as appropriate: medications, allergies, medical history  Review of Systems:  No other skin or systemic complaints except as noted in HPI or Assessment and Plan.  Objective  Well appearing patient in no apparent distress; mood and affect are within normal limits.   A focused examination was performed of the following areas: Upper body skin exam.  Relevant exam findings are noted in the Assessment and Plan.    Assessment & Plan   ACTINIC KERATOSIS Exam: Erythematous thin papules/macules with gritty scale  Actinic keratoses are precancerous spots that appear secondary to cumulative UV radiation exposure/sun exposure over time. They are chronic with expected duration over 1 year. A portion of actinic keratoses will progress to squamous cell carcinoma of the skin. It is not possible to reliably predict which spots will progress to skin cancer and so treatment is recommended to prevent development of skin cancer.  Recommend daily broad spectrum sunscreen SPF 30+ to sun-exposed areas, reapply every 2 hours as needed.  Recommend staying in the shade or wearing long sleeves, sun glasses (UVA+UVB protection) and wide brim hats (4-inch brim around the entire circumference of the hat). Call for new or changing lesions.  Treatment Plan:  Prior to procedure,  discussed risks of blister formation, small wound, skin dyspigmentation, or rare scar following cryotherapy. Recommend Vaseline ointment to treated areas while healing.  Destruction Procedure Note Destruction method: cryotherapy   Informed consent: discussed and consent obtained   Lesion destroyed using liquid nitrogen: Yes   Outcome: patient tolerated procedure well with no complications   Post-procedure details: wound care instructions given   Locations: face, scalp, arms # of Lesions Treated: 25   INFLAMED SEBORRHEIC KERATOSIS Exam: Erythematous keratotic or waxy stuck-on papule or plaque.  Symptomatic, irritating, patient would like treated.  Benign-appearing.  Call clinic for new or changing lesions.   Prior to procedure, discussed risks of blister formation, small wound, skin dyspigmentation, or rare scar following treatment. Recommend Vaseline ointment to treated areas while healing.  Destruction Procedure Note Destruction method: cryotherapy   Informed consent: discussed and consent obtained   Lesion destroyed using liquid nitrogen: Yes   Outcome: patient tolerated procedure well with no complications   Post-procedure details: wound care instructions given   Locations: L preauricular x 1, arms, trunk # of Lesions Treated: 18   LENTIGINES, SEBORRHEIC KERATOSES, HEMANGIOMAS - Benign normal skin lesions - Benign-appearing - Call for any changes  MELANOCYTIC NEVI - Tan-brown and/or pink-flesh-colored symmetric macules and papules - Benign appearing on exam today - Observation - Call clinic for new or changing moles - Recommend daily use of broad spectrum spf 30+ sunscreen to sun-exposed areas.   ACTINIC DAMAGE WITH PRECANCEROUS ACTINIC KERATOSES Counseling for Topical Chemotherapy Management: Patient exhibits: - Severe, confluent actinic changes with pre-cancerous actinic keratoses that is secondary to cumulative UV radiation exposure  over time - Condition that is  severe; chronic, not at goal. - diffuse scaly erythematous macules and papules with underlying dyspigmentation - Discussed Prescription "Field Treatment" topical Chemotherapy for Severe, Chronic Confluent Actinic Changes with Pre-Cancerous Actinic Keratoses Field treatment involves treatment of an entire area of skin that has confluent Actinic Changes (Sun/ Ultraviolet light damage) and PreCancerous Actinic Keratoses by method of PhotoDynamic Therapy (PDT) and/or prescription Topical Chemotherapy agents such as 5-fluorouracil, 5-fluorouracil/calcipotriene, and/or imiquimod.  The purpose is to decrease the number of clinically evident and subclinical PreCancerous lesions to prevent progression to development of skin cancer by chemically destroying early precancer changes that may or may not be visible.  It has been shown to reduce the risk of developing skin cancer in the treated area. As a result of treatment, redness, scaling, crusting, and open sores may occur during treatment course. One or more than one of these methods may be used and may have to be used several times to control, suppress and eliminate the PreCancerous changes. Discussed treatment course, expected reaction, and possible side effects. - Recommend daily broad spectrum sunscreen SPF 30+ to sun-exposed areas, reapply every 2 hours as needed.  - Staying in the shade or wearing long sleeves, sun glasses (UVA+UVB protection) and wide brim hats (4-inch brim around the entire circumference of the hat) are also recommended. - Call for new or changing lesions.  - In 1 month Start 5-fluorouracil/calcipotriene cream twice a day for 7 days to affected areas including scalp. Prescription originally sent to [facility]. Patient provided with handout reviewing treatment course and side effects and advised to call or message [place] on MyChart with any concerns.  Reviewed course of treatment and expected reaction.  Patient advised to  expect inflammation and crusting and advised that erosions are possible.  Patient advised to be diligent with sun protection during and after treatment. Counseled to keep medication out of reach of children and pets.   SKIN CANCER SCREENING PERFORMED TODAY   EPIDERMAL CYST / OPEN COMEDONE Posterior neck Exam: Subcutaneous nodule 1.2 x 0.5cm  Benign-appearing. Exam most consistent with an epidermal inclusion cyst. Discussed that a cyst is a benign growth that can grow over time and sometimes get irritated or inflamed. Recommend observation if it is not bothersome. Discussed option of surgical excision to remove it if it is growing, symptomatic, or other changes noted. Please call for new or changing lesions so they can be evaluated.     Return in about 6 months (around 03/15/2023) for Hx of AKs.  I, Othelia Pulling, RMA, am acting as scribe for Sarina Ser, MD .   Documentation: I have reviewed the above documentation for accuracy and completeness, and I agree with the above.  Sarina Ser, MD

## 2022-09-17 ENCOUNTER — Encounter: Payer: Self-pay | Admitting: Dermatology

## 2023-01-24 NOTE — Progress Notes (Signed)
GENETIC TEST RESULTS   Patient Name: Joe Tyler Encounter Date: 05/02/2022   HPI:  Joe Tyler was previously seen in the [facility] due to a personal and family history of cancer and concerns regarding a hereditary predisposition to cancer. Please refer to our prior cancer genetics clinic note for more information regarding our discussion, assessment and recommendations, at the time. Joe Tyler recent genetic test results were disclosed to her, as were recommendations warranted by these results. These results and recommendations are discussed in more detail below.  Joe Tyler is a 82 y.o. male with no personal history of cancer.  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  He also has a history of several hyperplastic polyps.  His most recent colonoscopy was in 2016, and he has a colonoscopy and upper endoscopy scheduled for the end of November.  He does not report any history of mucocutaneous freckling.   FAMILY HISTORY:  We obtained a detailed, 4-generation family history.  Significant diagnoses are listed below:      Family History  Problem Relation Age of Onset   Pancreatic cancer Mother 53   Stomach cancer Maternal Aunt          d. 44   Cancer Maternal Uncle          x2 two mat uncles (identical twins), dx 5s   Lung cancer Paternal Uncle          d. 5s; smoking hx   Ovarian cancer Maternal Grandmother          dx 38s   Cancer Cousin          unknown type; maternal male cousin; dx after 25   Breast cancer Cousin          pat male cousin; dx before 91           Joe Tyler is unaware of previous family history of genetic testing for hereditary cancer risks. There is no reported Ashkenazi Jewish ancestry. There is no known consanguinity.  GENETIC TEST RESULTS: Genetic testing reported out on 04/24/2022 through the  [facility]RNA [facility]. 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   No other pathogenic variants were detected in the [facility]RNA Panel.  The Multi-Cancer + RNA Panel offered by [facility] includes sequencing and/or deletion/duplication analysis of the following 84 genes:  AIP*, ALK, APC*, ATM*, AXIN2*, BAP1*, BARD1*, BLM*, BMPR1A*, BRCA1*, BRCA2*, BRIP1*, CASR, CDC73*, CDH1*, CDK4, CDKN1B*, CDKN1C*, CDKN2A, CEBPA, CHEK2*, CTNNA1*, DICER1*, DIS3L2*, EGFR, EPCAM, FH*, FLCN*, GATA2*, GPC3, GREM1, HOXB13, HRAS, KIT, MAX*, MEN1*, MET, MITF, MLH1*, MSH2*, MSH3*, MSH6*, MUTYH*, NBN*, NF1*, NF2*, NTHL1*, PALB2*, PDGFRA, PHOX2B, PMS2*, POLD1*, POLE*, POT1*, PRKAR1A*, PTCH1*, PTEN*, RAD50*, RAD51C*, RAD51D*, RB1*, RECQL4, RET, RUNX1*, SDHA*, SDHAF2*, SDHB*, SDHC*, SDHD*, SMAD4*, SMARCA4*, SMARCB1*, SMARCE1*, STK11*, SUFU*, TERC, TERT, TMEM127*, Tp53*, TSC1*, TSC2*, VHL*, WRN*, and WT1.  RNA analysis is performed for * genes.   The test report will be scanned into EPIC and located under the Molecular Pathology section of the Results Review tab.  A portion of the result report is included below for reference.    Of note, no pathogenic variants were detected in the STK11 gene.  These results do not explain his history of his Peutz Jeghers polyp.  Based on current available information, his history is not consistent with a clinical diagnosis of Puetz Jeghers  Syndorme at this time.  He should continue to follow the recommendations of his GI providers and PCP.     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 This means that the variants were not found in every cell in the body, as is typically expected with a hereditary cancer syndrome/germline pathogenic variant. We discussed that the cause/origin of these TP53 variants is undetermined at this time.  Two possible  explanations include:   Mosaic Li-Fraumeni syndrome:  Joe Tyler may have a hereditary cancer syndrome associated with the TP53 gene called Li-Fraumeni syndrome, but only in some of his cells. This is called mosaic Li-Fraumeni syndrome. In this case, he would have been born with the mutation(s) and his family members would also be at risk for having Li-Fraumeni syndrome. We briefly reviewed cancer risks and screening recommendations for those with Li-Fraumeni syndrome.  Affecting both children and adults, LFS is traditionally characterized by an increased risk of a broad spectrum of tumor types, including the following "core" cancers: sarcoma (osteosarcoma, or soft tissue), pre-menopausal breast cancer, brain tumors, leukemia, and adrenocortical carcinoma (ACC). In addition, a variety of other cancers may occur, not excluding ovarian cancer. Men with LFS have an overall 73% chance of developing cancer, and women with LFS have a nearly 100% risk of cancer due to the high incidence of breast cancer. For those who have already had cancer, studies have shown that individuals with LFS have a 40-49% risk to develop a second primary cancer.  Testing Joe Tyler relatives can determine who may have inherited the mutation(s), and therefore needs increased surveillance. Testing can also determine who has not inherited the mutation(s), and therefore may not require increased surveillance. Because we know the exact change in his TP53 gene, we are able to offer specific testing to other family members.   Clonal Hematopoiesis of Indeterminate Potential:  Joe Tyler may have acquired these mutations sporadically over time. This is called clonal hematopoiesis of indeterminate potential (CHIP) and occurs when somatic (acquired) mutations are found in a portion of the bone marrow and/or blood. The prevalence of CHIP rises with age (found in roughly 10% of individuals ages 63 to 58). The genetic mutations found in those  with CHIP are NOT associated with Li-Fraumeni syndrome and are not passed on to children. While CHIP is not a hereditary cancer syndrome, it does increase the risk for blood cancers (0.5-1% risk per year) as well as heart disease. Management for individuals with CHIP should include monitoring for blood cancer by routine complete blood count (CBC) screening and reducing risk factors for heart disease.    Currently there are no evidence-based guidelines for managing patients with clonal hematopoiesis. For Joe Tyler medical providers (per [employer]): Monitoring of the patient with clonal hematopoiesis should be individualized. Some clinics evaluate individuals every three to six months with an interval history, directed physical examination, and complete blood count (CBC) and differential. They suggest not routinely repeating DNA sequencing and not routinely repeating a bone marrow examination, unless there is another indication. The nature and frequency of monitoring is influenced by the level of variant allele frequency (VAF), presence of cytopenias, and concerns of the clinician and affected individual. Healthcare providers may modify the frequency of visits and/or CBCs in individuals with one or more of the following: High-risk mutation (eg, TP53 mutation) VAF ?20 percent ?2 distinct mutations Cytopenias, especially if associated with clinical findings (eg, cardiorespiratory compromise,  infections, bleeding/bruising) and/or other associated cardiovascular risk factors.   Again, at this time we do not know if Joe Tyler was born with one or both of these mutations, or if they occurred sporadically over time. Additional testing may help clarify the origin of these mutations.   ADDITIONAL GENETIC TESTING:  We discussed recommendations to test his children for Li-Fraumeni syndrome and to test another tissue type (usually a skin sample) from Joe Tyler to see if the mutation(s) may be present in other  parts of his body. If either of these additional tests show the TP53 mutation(s), it would be consistent with Li-Fraumeni syndrome. If these tests do not show the TP53 mutations, this would be reassuring but could not completely eliminate the possibility of mosaic Li-Fraumeni syndrome. We recommended that Joe Tyler family members coordinate their testing through a genetic specialist to ensure that pre-test genetic counseling regarding cancer risks and management options is performed and the correct test is ordered.  Joe Tyler plans to discuss this result with his children and help them set up genetic counseling with [place] for discussion of testing. Once we have their results (if they elect testing), we will discuss the appropriateness of further testing on a skin biopsy for Joe Tyler and help coordinate this testing as needed.   Our contact information was provided should he have additional questions or concerns about this result.    Rubina Basinski M. Rennie Plowman, MS, [facility]or [email or web address] (P) [phone]

## 2023-03-25 ENCOUNTER — Ambulatory Visit: Payer: BLUE CROSS/BLUE SHIELD | Admitting: Dermatology

## 2023-03-25 ENCOUNTER — Ambulatory Visit (INDEPENDENT_AMBULATORY_CARE_PROVIDER_SITE_OTHER): Payer: Medicare Other | Admitting: Dermatology

## 2023-03-25 ENCOUNTER — Encounter: Payer: Self-pay | Admitting: Dermatology

## 2023-03-25 VITALS — BP 127/71

## 2023-03-25 DIAGNOSIS — Z1283 Encounter for screening for malignant neoplasm of skin: Secondary | ICD-10-CM | POA: Diagnosis not present

## 2023-03-25 DIAGNOSIS — D1801 Hemangioma of skin and subcutaneous tissue: Secondary | ICD-10-CM

## 2023-03-25 DIAGNOSIS — L918 Other hypertrophic disorders of the skin: Secondary | ICD-10-CM

## 2023-03-25 DIAGNOSIS — L570 Actinic keratosis: Secondary | ICD-10-CM | POA: Diagnosis not present

## 2023-03-25 DIAGNOSIS — D692 Other nonthrombocytopenic purpura: Secondary | ICD-10-CM | POA: Diagnosis not present

## 2023-03-25 DIAGNOSIS — L821 Other seborrheic keratosis: Secondary | ICD-10-CM

## 2023-03-25 DIAGNOSIS — L578 Other skin changes due to chronic exposure to nonionizing radiation: Secondary | ICD-10-CM

## 2023-03-25 DIAGNOSIS — W908XXA Exposure to other nonionizing radiation, initial encounter: Secondary | ICD-10-CM | POA: Diagnosis not present

## 2023-03-25 DIAGNOSIS — L814 Other melanin hyperpigmentation: Secondary | ICD-10-CM

## 2023-03-25 DIAGNOSIS — D229 Melanocytic nevi, unspecified: Secondary | ICD-10-CM

## 2023-03-25 DIAGNOSIS — L820 Inflamed seborrheic keratosis: Secondary | ICD-10-CM

## 2023-03-25 NOTE — Patient Instructions (Addendum)

## 2023-03-25 NOTE — Progress Notes (Signed)
Follow-Up Visit   Subjective  Joe Tyler is a 82 y.o. male who presents for the following: Skin Cancer Screening and Upper Body Skin Exam AK 50m f/u, pt used 5FU/Calcipotriene on scalp did not get big reaction, check spot nose ~16m, no symptoms, check spots R ant thigh, check back  The patient presents for Upper Body Skin Exam (UBSE) for skin cancer screening and mole check. The patient has spots, moles and lesions to be evaluated, some may be new or changing and the patient may have concern these could be cancer.  The following portions of the chart were reviewed this encounter and updated as appropriate: medications, allergies, medical history  Review of Systems:  No other skin or systemic complaints except as noted in HPI or Assessment and Plan.  Objective  Well appearing patient in no apparent distress; mood and affect are within normal limits.  All skin waist up examined. Relevant physical exam findings are noted in the Assessment and Plan.  scalp, face, ears x 15, arms x 3 (18) Pink scaly macules  scalp, nose, arms, hands x 18 (18) Stuck on waxy paps with erythema    Assessment & Plan   AK (actinic keratosis) (18) scalp, face, ears x 15, arms x 3  Actinic keratoses are precancerous spots that appear secondary to cumulative UV radiation exposure/sun exposure over time. They are chronic with expected duration over 1 year. A portion of actinic keratoses will progress to squamous cell carcinoma of the skin. It is not possible to reliably predict which spots will progress to skin cancer and so treatment is recommended to prevent development of skin cancer.  Recommend daily broad spectrum sunscreen SPF 30+ to sun-exposed areas, reapply every 2 hours as needed.  Recommend staying in the shade or wearing long sleeves, sun glasses (UVA+UVB protection) and wide brim hats (4-inch brim around the entire circumference of the hat). Call for new or changing lesions.  Destruction of  lesion - scalp, face, ears x 15, arms x 3 (18) Complexity: simple   Destruction method: cryotherapy   Informed consent: discussed and consent obtained   Timeout:  patient name, date of birth, surgical site, and procedure verified Lesion destroyed using liquid nitrogen: Yes   Region frozen until ice ball extended beyond lesion: Yes   Outcome: patient tolerated procedure well with no complications   Post-procedure details: wound care instructions given    Inflamed seborrheic keratosis (18) scalp, nose, arms, hands x 18  Symptomatic, irritating, patient would like treated.  Destruction of lesion - scalp, nose, arms, hands x 18 (18) Complexity: simple   Destruction method: cryotherapy   Informed consent: discussed and consent obtained   Timeout:  patient name, date of birth, surgical site, and procedure verified Lesion destroyed using liquid nitrogen: Yes   Region frozen until ice ball extended beyond lesion: Yes   Outcome: patient tolerated procedure well with no complications   Post-procedure details: wound care instructions given     Skin cancer screening performed today.  Actinic Damage - Chronic condition, secondary to cumulative UV/sun exposure - diffuse scaly erythematous macules with underlying dyspigmentation - Recommend daily broad spectrum sunscreen SPF 30+ to sun-exposed areas, reapply every 2 hours as needed.  - Staying in the shade or wearing long sleeves, sun glasses (UVA+UVB protection) and wide brim hats (4-inch brim around the entire circumference of the hat) are also recommended for sun protection.  - Call for new or changing lesions.  Lentigines, Seborrheic Keratoses, Hemangiomas - Benign normal  skin lesions - Benign-appearing - Call for any changes  Melanocytic Nevi - Tan-brown and/or pink-flesh-colored symmetric macules and papules - Benign appearing on exam today - Observation - Call clinic for new or changing moles - Recommend daily use of broad  spectrum spf 30+ sunscreen to sun-exposed areas.   Purpura - Chronic; persistent and recurrent.  Treatable, but not curable. - Violaceous macules and patches - Benign - Related to trauma, age, sun damage and/or use of blood thinners, chronic use of topical and/or oral steroids - Observe - Can use OTC arnica containing moisturizer such as Dermend Bruise Formula if desired - Call for worsening or other concerns  Acrochordons (Skin Tags) - Fleshy, skin-colored pedunculated papules - Benign appearing.  - Observe. - If desired, they can be removed with an in office procedure that is not covered by insurance. - Please call the clinic if you notice any new or changing lesions.   Return in about 6 months (around 09/23/2023) for AK f/u.  I, Ardis Rowan, RMA, am acting as scribe for [name], MD .   Documentation: I have reviewed the above documentation for accuracy and completeness, and I agree with the above.  [name], MD

## 2023-07-10 ENCOUNTER — Ambulatory Visit (INDEPENDENT_AMBULATORY_CARE_PROVIDER_SITE_OTHER): Payer: Medicare Other

## 2023-07-10 ENCOUNTER — Encounter: Payer: Self-pay | Admitting: Emergency Medicine

## 2023-07-10 ENCOUNTER — Ambulatory Visit
Admission: EM | Admit: 2023-07-10 | Discharge: 2023-07-10 | Disposition: A | Discharge status: Home / Self Care | Payer: Medicare Other | Attending: Emergency Medicine | Admitting: Emergency Medicine

## 2023-07-10 DIAGNOSIS — M79601 Pain in right arm: Secondary | ICD-10-CM | POA: Diagnosis not present

## 2023-07-10 DIAGNOSIS — T148XXA Other injury of unspecified body region, initial encounter: Secondary | ICD-10-CM | POA: Diagnosis not present

## 2023-07-10 NOTE — ED Provider Notes (Addendum)
[facility]    CSN: [id number] Arrival date & time: 07/10/23  1631      History   Chief Complaint Chief Complaint  Patient presents with   Arm Pain    HPI Joe Tyler is a 83 y.o. male.   83 year old male patient, Joe Tyler, presents to urgent care for evaluation of right upper arm pain that started approximately 30 minutes prior to arrival.  Patiewnt points to bicep/deltoid area as area of pain.Patient states he was tying his wife's shoe and when he stood up and grabbed the door lever he "heard a pop".  Patient immediately felt sharp pain in right upper arm is unsure of injury, patient denies fall or head strike or loss of consciousness.  Patient is right-hand dominant. No meds or ice applied PTA.  The history is provided by the patient. No language interpreter was used.    Past Medical History:  Diagnosis Date   Actinic keratosis    Hx PDT   Anxiety    Panic Attack   Arthritis    Diabetes mellitus without complication (HCC)    Family history of breast cancer 04/04/2022   Family history of pancreatic cancer 04/04/2022   Gout    History of colonic polyps 04/04/2022   Hyperlipidemia    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HCC)    Shingles    Spinal stenosis     Patient Active Problem List   Diagnosis Date Noted   Right arm pain 07/10/2023   Muscle tear 07/10/2023   Genetic testing 04/30/2022   History of colonic polyps 04/04/2022   Family history of breast cancer 04/04/2022   Family history of pancreatic cancer 04/04/2022   Pain in limb 02/13/2021   Pain of right shoulder joint on movement 12/17/2018   Arm numbness 08/18/2018   Bilateral hand pain 08/18/2018   Neck pain 08/18/2018   Numbness and tingling 06/30/2018   Benign prostatic hyperplasia with urinary frequency 03/06/2018   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   Abnormal EKG 06/06/2016   Chronic hip pain, left 03/04/2016   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  06/08/2015   HLD (hyperlipidemia) 05/04/2015   Arthritis, degenerative 05/04/2015   History of Peutz-Jeghers polyp 05/04/2015   Spinal stenosis 05/04/2015   Chronic gouty arthritis 03/03/2015    Past Surgical History:  Procedure Laterality Date   benign tumor removed Right arm   BILATERAL HIP ARTHROSCOPY     COLONOSCOPY WITH PROPOFOL N/A 04/07/2015   Procedure: COLONOSCOPY WITH PROPOFOL;  Surgeon: Scot Jun, MD;  Location: [facility] ENDOSCOPY;  Service: Endoscopy;  Laterality: N/A;   COLONOSCOPY WITH PROPOFOL N/A 05/13/2022   Procedure: COLONOSCOPY WITH PROPOFOL;  Surgeon: Regis Bill, MD;  Location: [facility] ENDOSCOPY;  Service: Endoscopy;  Laterality: N/A;   ESOPHAGOGASTRODUODENOSCOPY (EGD) WITH PROPOFOL N/A 04/07/2015   Procedure: ESOPHAGOGASTRODUODENOSCOPY (EGD) WITH PROPOFOL;  Surgeon: Scot Jun, MD;  Location: [facility] ENDOSCOPY;  Service: Endoscopy;  Laterality: N/A;   ESOPHAGOGASTRODUODENOSCOPY (EGD) WITH PROPOFOL N/A 05/13/2022   Procedure: ESOPHAGOGASTRODUODENOSCOPY (EGD) WITH PROPOFOL;  Surgeon: Regis Bill, MD;  Location: [facility] ENDOSCOPY;  Service: Endoscopy;  Laterality: N/A;   JOINT REPLACEMENT     LUMBAR LAMINECTOMY/DECOMPRESSION MICRODISCECTOMY N/A 06/26/2016   Procedure: LUMBAR LAMINECTOMY/DECOMPRESSION MICRODISCECTOMY 2 LEVELS L5-S1;  Surgeon: Venetia Night, MD;  Location: [facility] ORS;  Service: Neurosurgery;  Laterality: N/A;   TONSILLECTOMY         Home Medications    Prior to Admission medications   Medication Sig Start Date  End Date Taking? Authorizing Provider  allopurinol (ZYLOPRIM) 300 MG tablet Take 300 mg by mouth daily.    [provider]  ALPRAZolam Prudy Feeler) 0.25 MG tablet Take by mouth as needed. 09/06/20   [provider]  gabapentin (NEURONTIN) 100 MG capsule Take 200 mg by mouth 3 (three) times daily. Patient not taking: Reported on 05/13/2022    [provider]  methylPREDNISolone (MEDROL DOSEPAK) 4 MG  TBPK tablet Take by mouth daily. Follow package instructions Patient not taking: Reported on 05/13/2022 04/28/21   Domenick Gong, MD  Multiple Vitamins-Minerals (ICAPS AREDS 2 PO) Take 2 capsules by mouth daily.    [provider]  terbinafine (LAMISIL) 250 MG tablet Take 1 tablet (250 mg total) by mouth daily. 10/31/20   Deirdre Evener, MD    Family History Family History  Problem Relation Age of Onset   Pancreatic cancer Mother 25   Stomach cancer Maternal Aunt        d. 49   Cancer Maternal Uncle        x2 two mat uncles (identical twins), dx 23s   Lung cancer Paternal Uncle        d. 8s; smoking hx   Ovarian cancer Maternal Grandmother        dx 7s   Cancer Cousin        unknown type; maternal male cousin; dx after 7   Breast cancer Cousin        pat male cousin; dx before 32    Social History Social History   Tobacco Use   Smoking status: Former   Smokeless tobacco: Never  Advertising account planner   Vaping status: Never Used  Substance Use Topics   Alcohol use: Yes    Comment: social   Drug use: No     Allergies   Levaquin [levofloxacin]   Review of Systems Review of Systems  Constitutional:  Negative for fever.  Musculoskeletal:  Positive for myalgias.  All other systems reviewed and are negative.    Physical Exam Triage Vital Signs ED Triage Vitals  Encounter Vitals Group     BP      Systolic BP Percentile      Diastolic BP Percentile      Pulse      Resp      Temp      Temp src      SpO2      Weight      Height      Head Circumference      Peak Flow      Pain Score      Pain Loc      Pain Education      Exclude from Growth Chart    No data found.  Updated Vital Signs BP (!) 155/84 (BP Location: Left Arm)   Pulse 99   Temp (!) 97.4 F (36.3 C) (Oral)   Resp 16   SpO2 96%   Visual Acuity Right Eye Distance:   Left Eye Distance:   Bilateral Distance:    Right Eye Near:   Left Eye Near:    Bilateral Near:      Physical Exam Vitals and nursing note reviewed.  Constitutional:      Appearance: He is well-developed.  HENT:     Head: Normocephalic.  Cardiovascular:     Rate and Rhythm: Normal rate.     Pulses: Normal pulses.          Radial pulses are 2+  on the right side.  Pulmonary:     Effort: Pulmonary effort is normal.  Musculoskeletal:     Right upper arm: Swelling and tenderness present.       Arms:     Comments: +pain with rotation/extension of right arm  Neurological:     General: No focal deficit present.     Mental Status: He is alert and oriented to person, place, and time.     GCS: GCS eye subscore is 4. GCS verbal subscore is 5. GCS motor subscore is 6.  Psychiatric:        Attention and Perception: Attention normal.        Mood and Affect: Mood normal.        Speech: Speech normal.        Behavior: Behavior is cooperative.     UC Treatments / Results  Labs (all labs ordered are listed, but only abnormal results are displayed) Labs Reviewed - No data to display  EKG   Radiology DG Humerus Right Result Date: 07/10/2023 CLINICAL DATA:  Right upper arm pain EXAM: RIGHT HUMERUS - 2+ VIEW COMPARISON:  None Available. FINDINGS: No acute bony abnormality. Specifically, no fracture, subluxation, or dislocation. Degenerative changes in the right AC joint. IMPRESSION: No acute bony abnormality. Electronically Signed   By: Charlett Nose M.D.   On: 07/10/2023 17:21    Procedures Procedures (including critical care time)  Medications Ordered in UC Medications - No data to display  Initial Impression / Assessment and Plan / UC Course  I have reviewed the triage vital signs and the nursing notes.  Pertinent labs & imaging results that were available during my care of the patient were reviewed by me and considered in my medical decision making (see chart for details).  Clinical Course as of 07/10/23 1732  Thu Jul 10, 2023  1653 Tylenol offered for pain,pt declined, will  xray humerus d/t pain [JD]  1725 Xray is negative for fracture,. Most likely this is partial(biceps/deltoid tear), wear sling for support. May take tylenol as label directed for pain, call and follow up with Orthopedics-call tomorrow. [JD]    Clinical Course User Index [JD] Kamora Vossler, Para March, NP  Discussed exam findings and plan of care with patient, sling applied by staff, NV intact pre/post application,strict go to ER precautions given.   Patient verbalized understanding to this provider.  Ddx: Right arm pain, muscle tear(partial vs complete) Final Clinical Impressions(s) / UC Diagnoses   Final diagnoses:  Right arm pain  Muscle tear     Discharge Instructions      Your xray is negative for fracture, wear sling for support. May take tylenol as label directed for pain, call your Orthopedist at [facility] tomorrow for follow up, or [facility]. Return as needed.  [facility]: [address] [place], [place] [address] Phone: [phone] Urgent care hours 8a-7:30p Mon-Sat     ED Prescriptions   None    PDMP not reviewed this encounter.   Clancy Gourd, NP 07/10/23 1732    Clancy Gourd, NP 07/10/23 9147

## 2023-07-10 NOTE — ED Triage Notes (Signed)
Pt presents with right upper arm pain that started today. He was tying his wife shoes and when he stood up to open the door he heard a pop. He only has pain with movement.

## 2023-07-10 NOTE — Discharge Instructions (Signed)
Your xray is negative for fracture, wear sling for support. May take tylenol as label directed for pain, call your Orthopedist at [facility] tomorrow for follow up, or [facility]. Return as needed.  [facility]: [address] [place], [place] [address] Phone: [phone] Urgent care hours 8a-7:30p Mon-Sat

## 2023-07-21 ENCOUNTER — Other Ambulatory Visit (INDEPENDENT_AMBULATORY_CARE_PROVIDER_SITE_OTHER): Payer: Self-pay | Admitting: Nurse Practitioner

## 2023-07-21 DIAGNOSIS — I739 Peripheral vascular disease, unspecified: Secondary | ICD-10-CM

## 2023-07-22 ENCOUNTER — Encounter (INDEPENDENT_AMBULATORY_CARE_PROVIDER_SITE_OTHER): Payer: Self-pay | Admitting: Nurse Practitioner

## 2023-07-22 ENCOUNTER — Ambulatory Visit (INDEPENDENT_AMBULATORY_CARE_PROVIDER_SITE_OTHER): Payer: Medicare Other | Admitting: Nurse Practitioner

## 2023-07-22 ENCOUNTER — Ambulatory Visit (INDEPENDENT_AMBULATORY_CARE_PROVIDER_SITE_OTHER): Payer: Medicare Other

## 2023-07-22 VITALS — BP 130/74 | HR 80 | Resp 18 | Ht 70.0 in | Wt 192.6 lb

## 2023-07-22 DIAGNOSIS — I739 Peripheral vascular disease, unspecified: Secondary | ICD-10-CM | POA: Diagnosis not present

## 2023-07-22 DIAGNOSIS — M48062 Spinal stenosis, lumbar region with neurogenic claudication: Secondary | ICD-10-CM | POA: Diagnosis not present

## 2023-07-22 NOTE — Progress Notes (Signed)
 Subjective:    Patient ID: Joe Tyler, male    DOB: Jun 29, 1940, 83 y.o.   MRN: [id number] Chief Complaint  Patient presents with   New Patient (Initial Visit)     np. ABI + consult. PVD. Ref: Joe, Tyler is a 83 year old man who presents today for evaluation of cramping in his right calf.  He notes that it happens in his foot and calf.  Usually happens during sleeping but also he notes that if he sits in his recliner and leans back it also causes some numbness of his right lower extremity 2.  Initially he was concerned about overall perfusion but he had a recent sacroiliac joint injection and it was noted that the patient may have pain related to his known lower back issues.  He recently found some exercises that he did to help with his lower back issues and since he has been doing that this cramping is actually eased off.  He notes that by doing things to help his positioning many of the symptoms that he had have also started to subside.  Today his ABI is 1.10 on the right and 1.06 on the left.  He has good triphasic tibial artery waveforms with normal toe waveforms bilaterally.  This is consistent with the previous studies he had done several years ago    Review of Systems  All other systems reviewed and are negative.      Objective:   Physical Exam Vitals reviewed.  HENT:     Head: Normocephalic.  Cardiovascular:     Rate and Rhythm: Normal rate.  Pulmonary:     Effort: Pulmonary effort is normal.  Musculoskeletal:        General: Signs of injury present.  Skin:    General: Skin is warm and dry.  Neurological:     Mental Status: He is alert and oriented to person, place, and time.  Psychiatric:        Mood and Affect: Mood normal.        Behavior: Behavior normal.        Thought Content: Thought content normal.        Judgment: Judgment normal.     BP 130/74   Pulse 80   Resp 18   Ht 5' 10 (1.778 m)   Wt 192 lb 9.6 oz (87.4 kg)   BMI  27.64 kg/m   Past Medical History:  Diagnosis Date   Actinic keratosis    Hx PDT   Anxiety    Panic Attack   Arthritis    Diabetes mellitus without complication (HCC)    Family history of breast cancer 04/04/2022   Family history of pancreatic cancer 04/04/2022   Gout    History of colonic polyps 04/04/2022   Hyperlipidemia    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HCC)    Shingles    Spinal stenosis     Social History   Socioeconomic History   Marital status: Married    Spouse name: Not on file   Number of children: Not on file   Years of education: Not on file   Highest education level: Not on file  Occupational History   Not on file  Tobacco Use   Smoking status: Former   Smokeless tobacco: Never  Vaping Use   Vaping status: Never Used  Substance and Sexual Activity   Alcohol use: Yes    Comment: social   Drug use: No   Sexual activity: Not on file  Other Topics Concern   Not on file  Social History Narrative   Not on file   Social Drivers of Health   Financial Resource Strain: Low Risk  (07/15/2023)   Received from [facility]   Overall Financial Resource Strain (CARDIA)    Difficulty of Paying Living Expenses: Not hard at all  Food Insecurity: No Food Insecurity (07/15/2023)   Received from [facility]   Hunger Vital Sign    Worried About Running Out of Food in the Last Year: Never true    Ran Out of Food in the Last Year: Never true  Transportation Needs: No Transportation Needs (07/15/2023)   Received from [facility] - Transportation    In the past 12 months, has lack of transportation kept you from medical appointments or from getting medications?: No    Lack of Transportation (Non-Medical): No  Physical Activity: Not on file  Stress: Not on file  Social Connections: Not on file  Intimate Partner Violence: Not on file    Past Surgical History:  Procedure Laterality Date   benign tumor removed  Right arm   BILATERAL HIP ARTHROSCOPY     COLONOSCOPY WITH PROPOFOL  N/A 04/07/2015   Procedure: COLONOSCOPY WITH PROPOFOL ;  Surgeon: Lamar ONEIDA Holmes, MD;  Location: [facility] ENDOSCOPY;  Service: Endoscopy;  Laterality: N/A;   COLONOSCOPY WITH PROPOFOL  N/A 05/13/2022   Procedure: COLONOSCOPY WITH PROPOFOL ;  Surgeon: Maryruth Ole ONEIDA, MD;  Location: [facility] ENDOSCOPY;  Service: Endoscopy;  Laterality: N/A;   ESOPHAGOGASTRODUODENOSCOPY (EGD) WITH PROPOFOL  N/A 04/07/2015   Procedure: ESOPHAGOGASTRODUODENOSCOPY (EGD) WITH PROPOFOL ;  Surgeon: Lamar ONEIDA Holmes, MD;  Location: [facility] ENDOSCOPY;  Service: Endoscopy;  Laterality: N/A;   ESOPHAGOGASTRODUODENOSCOPY (EGD) WITH PROPOFOL  N/A 05/13/2022   Procedure: ESOPHAGOGASTRODUODENOSCOPY (EGD) WITH PROPOFOL ;  Surgeon: Maryruth Ole ONEIDA, MD;  Location: [facility] ENDOSCOPY;  Service: Endoscopy;  Laterality: N/A;   JOINT REPLACEMENT     LUMBAR LAMINECTOMY/DECOMPRESSION MICRODISCECTOMY N/A 06/26/2016   Procedure: LUMBAR LAMINECTOMY/DECOMPRESSION MICRODISCECTOMY 2 LEVELS L5-S1;  Surgeon: Reeves Daisy, MD;  Location: [facility] ORS;  Service: Neurosurgery;  Laterality: N/A;   TONSILLECTOMY      Family History  Problem Relation Age of Onset   Pancreatic cancer Mother 33   Stomach cancer Maternal Aunt        d. 5   Cancer Maternal Uncle        x2 two mat uncles (identical twins), dx 15s   Lung cancer Paternal Uncle        d. 59s; smoking hx   Ovarian cancer Maternal Grandmother        dx 80s   Cancer Cousin        unknown type; maternal male cousin; dx after 16   Breast cancer Cousin        pat male cousin; dx before 35    Allergies  Allergen Reactions   Levaquin [Levofloxacin] Other (See Comments) and Anxiety    Possible panic disorder Panic Disorder       Latest Ref Rng & Units 06/19/2016   10:44 AM  CBC  WBC 3.8 - 10.6 K/uL 6.6   Hemoglobin 13.0 - 18.0 g/dL 83.8   Hematocrit 59.9 - 52.0 % 47.1   Platelets 150 - 440 K/uL 179       CMP      Component Value Date/Time   NA 138 06/19/2016 1044   K 4.0 06/19/2016 1044   CL 105 06/19/2016 1044   CO2 27 06/19/2016  1044   GLUCOSE 91 06/19/2016 1044   BUN 14 06/19/2016 1044   CREATININE 0.84 06/19/2016 1044   CALCIUM 9.8 06/19/2016 1044   PROT 6.9 07/11/2020 0917   ALBUMIN 4.7 07/11/2020 0917   AST 20 07/11/2020 0917   ALT 22 07/11/2020 0917   ALKPHOS 99 07/11/2020 0917   BILITOT 0.3 07/11/2020 0917   GFRNONAA >60 06/19/2016 1044     No results found.     Assessment & Plan:   1. PVD (peripheral vascular disease) (HCC) (Primary) Today noninvasive study shows good arterial perfusion.  Patient will return on an as-needed basis.  2. Lumbar stenosis with neurogenic claudication I feel that he is known lower back issues are the cause for his cramping.  [facility].  Patient will follow-up with him for any further steps or steroid shots is necessary.   Current Outpatient Medications on File Prior to Visit  Medication Sig Dispense Refill   allopurinol  (ZYLOPRIM ) 300 MG tablet Take 300 mg by mouth daily.     ALPRAZolam  (XANAX ) 0.25 MG tablet Take by mouth as needed.     cyanocobalamin (VITAMIN B12) 1000 MCG tablet Take by mouth.     latanoprost (XALATAN) 0.005 % ophthalmic solution SMARTSIG:In Eye(s)     Multiple Vitamins-Minerals (ICAPS AREDS 2 PO) Take 2 capsules by mouth daily.     gabapentin  (NEURONTIN ) 100 MG capsule Take 200 mg by mouth 3 (three) times daily. (Patient not taking: Reported on 05/13/2022)     methylPREDNISolone  (MEDROL  DOSEPAK) 4 MG TBPK tablet Take by mouth daily. Follow package instructions (Patient not taking: Reported on 05/13/2022) 21 tablet 0   terbinafine  (LAMISIL ) 250 MG tablet Take 1 tablet (250 mg total) by mouth daily. 30 tablet 0   No current facility-administered medications on file prior to visit.    There are no Patient Instructions on file for this visit. No follow-ups on file.   Lani Mendiola E Shelden Raborn, NP

## 2023-07-25 LAB — VAS US ABI WITH/WO TBI
Left ABI: 1.006
Right ABI: 1.1

## 2023-10-08 ENCOUNTER — Encounter: Payer: Self-pay | Admitting: Ophthalmology

## 2023-10-08 NOTE — Anesthesia Preprocedure Evaluation (Addendum)
 Anesthesia Evaluation  Patient identified by MRN, date of birth, ID band Patient awake    Reviewed: Allergy & Precautions, H&P , NPO status , Patient's Chart, lab work & pertinent test results  Airway Mallampati: III  TM Distance: >3 FB Neck ROM: Full    Dental no notable dental hx.  Permanent left upper bridge:   Pulmonary neg pulmonary ROS, former smoker   Pulmonary exam normal breath sounds clear to auscultation       Cardiovascular + Peripheral Vascular Disease  negative cardio ROS Normal cardiovascular exam Rhythm:Regular Rate:Normal  06-20-16 stress echo INTERPRETATION  Normal Stress Echocardiogram  TRIVIAL REGURGITATION NOTED (MR, TR, PR, AR) NO VALVULAR STENOSIS NOTED     Neuro/Psych   Anxiety      Neuromuscular disease negative neurological ROS  negative psych ROS   GI/Hepatic negative GI ROS, Neg liver ROS,,,  Endo/Other  negative endocrine ROSdiabetes    Renal/GU negative Renal ROS  negative genitourinary   Musculoskeletal negative musculoskeletal ROS (+) Arthritis ,    Abdominal   Peds negative pediatric ROS (+)  Hematology negative hematology ROS (+)   Anesthesia Other Findings Diabetes mellitus without complication (HCC)  Hyperlipidemia Gout Anxiety Peutz-Jeghers syndrome---rare, inherited disorder, mucocunateous pigmentation mucous membranes, esp mouth and nose, often around nose and eyes, dark blue, brown or black spots, may fade hamartomatous (benign) polyps GI, abdominal pain,  intussesception, rectal prolapse, gynecomastia in males, GI bleed, bowel obstruction, clubbing of fingers and toes, increase risk of certain cancers Arthritis Shingles Spinal stenosis Actinic keratosis History of colonic polyps Family history of breast cancer Family history of pancreatic cancer Wears hearing aid in both ears Motion sickness PVD (peripheral vascular disease)     Reproductive/Obstetrics negative OB  ROS                             Anesthesia Physical Anesthesia Plan  ASA: 2  Anesthesia Plan: MAC   Post-op Pain Management:    Induction: Intravenous  PONV Risk Score and Plan:   Airway Management Planned: Natural Airway and Nasal Cannula  Additional Equipment:   Intra-op Plan:   Post-operative Plan:   Informed Consent: I have reviewed the patients History and Physical, chart, labs and discussed the procedure including the risks, benefits and alternatives for the proposed anesthesia with the patient or authorized representative who has indicated his/her understanding and acceptance.     Dental Advisory Given  Plan Discussed with: Anesthesiologist, CRNA and Surgeon  Anesthesia Plan Comments: (Patient consented for risks of anesthesia including but not limited to:  - adverse reactions to medications - damage to eyes, teeth, lips or other oral mucosa - nerve damage due to positioning  - sore throat or hoarseness - Damage to heart, brain, nerves, lungs, other parts of body or loss of life  Patient voiced understanding and assent.)       Anesthesia Quick Evaluation

## 2023-10-14 NOTE — Discharge Instructions (Signed)

## 2023-10-15 ENCOUNTER — Encounter
Admission: RE | Disposition: A | Discharge status: Home / Self Care | Payer: Self-pay | Source: Home / Self Care | Attending: Ophthalmology

## 2023-10-15 ENCOUNTER — Ambulatory Visit
Admission: RE | Admit: 2023-10-15 | Discharge: 2023-10-15 | Disposition: A | Discharge status: Home / Self Care | Attending: Ophthalmology | Admitting: Ophthalmology

## 2023-10-15 ENCOUNTER — Encounter: Payer: Self-pay | Admitting: Ophthalmology

## 2023-10-15 ENCOUNTER — Ambulatory Visit: Admitting: Anesthesiology

## 2023-10-15 ENCOUNTER — Other Ambulatory Visit: Payer: Self-pay

## 2023-10-15 DIAGNOSIS — Z87891 Personal history of nicotine dependence: Secondary | ICD-10-CM | POA: Insufficient documentation

## 2023-10-15 DIAGNOSIS — E1136 Type 2 diabetes mellitus with diabetic cataract: Secondary | ICD-10-CM | POA: Diagnosis not present

## 2023-10-15 DIAGNOSIS — H2512 Age-related nuclear cataract, left eye: Secondary | ICD-10-CM | POA: Insufficient documentation

## 2023-10-15 HISTORY — PX: CATARACT EXTRACTION W/PHACO: SHX586

## 2023-10-15 HISTORY — DX: Peripheral vascular disease, unspecified: I73.9

## 2023-10-15 HISTORY — DX: Motion sickness, initial encounter: T75.3XXA

## 2023-10-15 HISTORY — DX: Presence of external hearing-aid: Z97.4

## 2023-10-15 SURGERY — PHACOEMULSIFICATION, CATARACT, WITH IOL INSERTION
Anesthesia: Monitor Anesthesia Care | Site: Eye | Laterality: Left

## 2023-10-15 MED ORDER — FENTANYL CITRATE (PF) 100 MCG/2ML IJ SOLN
INTRAMUSCULAR | Status: DC | PRN
  Administered 2023-10-15: 50 ug via INTRAVENOUS

## 2023-10-15 MED ORDER — SIGHTPATH DOSE#1 BSS IO SOLN
INTRAOCULAR | Status: DC | PRN
  Administered 2023-10-15: 76 mL via OPHTHALMIC

## 2023-10-15 MED ORDER — LIDOCAINE HCL (PF) 2 % IJ SOLN
INTRAOCULAR | Status: DC | PRN
  Administered 2023-10-15: 2 mL

## 2023-10-15 MED ORDER — ARMC OPHTHALMIC DILATING DROPS
OPHTHALMIC | Status: AC
  Filled 2023-10-15: qty 0.5

## 2023-10-15 MED ORDER — MIDAZOLAM HCL 2 MG/2ML IJ SOLN
INTRAMUSCULAR | Status: DC | PRN
  Administered 2023-10-15: 1 mg via INTRAVENOUS

## 2023-10-15 MED ORDER — SODIUM CHLORIDE 0.9% FLUSH
INTRAVENOUS | Status: DC | PRN
  Administered 2023-10-15: 10 mL via INTRAVENOUS

## 2023-10-15 MED ORDER — MIDAZOLAM HCL 2 MG/2ML IJ SOLN
INTRAMUSCULAR | Status: AC
Start: 2023-10-15 — End: ?
  Filled 2023-10-15: qty 2

## 2023-10-15 MED ORDER — SIGHTPATH DOSE#1 BSS IO SOLN
INTRAOCULAR | Status: DC | PRN
  Administered 2023-10-15: 15 mL via INTRAOCULAR

## 2023-10-15 MED ORDER — BRIMONIDINE TARTRATE-TIMOLOL 0.2-0.5 % OP SOLN
OPHTHALMIC | Status: DC | PRN
  Administered 2023-10-15: 1 [drp] via OPHTHALMIC

## 2023-10-15 MED ORDER — CEFUROXIME OPHTHALMIC INJECTION 1 MG/0.1 ML
INJECTION | OPHTHALMIC | Status: DC | PRN
  Administered 2023-10-15: 1 mg via INTRACAMERAL

## 2023-10-15 MED ORDER — TETRACAINE HCL 0.5 % OP SOLN
OPHTHALMIC | Status: AC
  Filled 2023-10-15: qty 4

## 2023-10-15 MED ORDER — FENTANYL CITRATE (PF) 100 MCG/2ML IJ SOLN
INTRAMUSCULAR | Status: AC
  Filled 2023-10-15: qty 2

## 2023-10-15 MED ORDER — TETRACAINE HCL 0.5 % OP SOLN
1.0000 [drp] | OPHTHALMIC | Status: DC | PRN
  Administered 2023-10-15 (×3): 1 [drp] via OPHTHALMIC

## 2023-10-15 MED ORDER — ARMC OPHTHALMIC DILATING DROPS
1.0000 | OPHTHALMIC | Status: DC | PRN
  Administered 2023-10-15 (×3): 1 via OPHTHALMIC

## 2023-10-15 MED ORDER — SIGHTPATH DOSE#1 NA HYALUR & NA CHOND-NA HYALUR IO KIT
PACK | INTRAOCULAR | Status: DC | PRN
  Administered 2023-10-15: 1 via OPHTHALMIC

## 2023-10-15 SURGICAL SUPPLY — 10 items
CATARACT SUITE SIGHTPATH (MISCELLANEOUS) ×1 IMPLANT
FEE CATARACT SUITE SIGHTPATH (MISCELLANEOUS) ×1 IMPLANT
GLOVE BIOGEL PI IND STRL 8 (GLOVE) ×1 IMPLANT
GLOVE SURG LX STRL 7.5 STRW (GLOVE) ×1 IMPLANT
GLOVE SURG PROTEXIS BL SZ6.5 (GLOVE) ×1 IMPLANT
GLOVE SURG SYN 6.5 PF PI BL (GLOVE) ×1 IMPLANT
LENS IOL TECNIS EYHANCE 19.0 (Intraocular Lens) IMPLANT
NDL FILTER BLUNT 18X1 1/2 (NEEDLE) ×1 IMPLANT
NEEDLE FILTER BLUNT 18X1 1/2 (NEEDLE) ×1 IMPLANT
SYR 3ML LL SCALE MARK (SYRINGE) ×1 IMPLANT

## 2023-10-15 NOTE — H&P (Signed)
  [facility]   Primary Care Physician:  Little Riff, MD Ophthalmologist: Dr. Annell Kidney  Pre-Procedure History & Physical: HPI:  Joe Tyler is a 83 y.o. male here for ophthalmic surgery.   Past Medical History:  Diagnosis Date   Actinic keratosis    Hx PDT   Anxiety    Panic Attack   Arthritis    Diabetes mellitus without complication (HCC)    no meds   Family history of breast cancer 04/04/2022   Family history of pancreatic cancer 04/04/2022   Gout    History of colonic polyps 04/04/2022   Hyperlipidemia    Motion sickness    ocean boats   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HCC)    PVD (peripheral vascular disease) (HCC)    Shingles    Spinal stenosis    Wears hearing aid in both ears     Past Surgical History:  Procedure Laterality Date   benign tumor removed Right arm   BILATERAL HIP ARTHROSCOPY     COLONOSCOPY WITH PROPOFOL  N/A 04/07/2015   Procedure: COLONOSCOPY WITH PROPOFOL ;  Surgeon: Cassie Click, MD;  Location: [facility] ENDOSCOPY;  Service: Endoscopy;  Laterality: N/A;   COLONOSCOPY WITH PROPOFOL  N/A 05/13/2022   Procedure: COLONOSCOPY WITH PROPOFOL ;  Surgeon: Shane Darling, MD;  Location: [facility] ENDOSCOPY;  Service: Endoscopy;  Laterality: N/A;   ESOPHAGOGASTRODUODENOSCOPY (EGD) WITH PROPOFOL  N/A 04/07/2015   Procedure: ESOPHAGOGASTRODUODENOSCOPY (EGD) WITH PROPOFOL ;  Surgeon: Cassie Click, MD;  Location: [facility] ENDOSCOPY;  Service: Endoscopy;  Laterality: N/A;   ESOPHAGOGASTRODUODENOSCOPY (EGD) WITH PROPOFOL  N/A 05/13/2022   Procedure: ESOPHAGOGASTRODUODENOSCOPY (EGD) WITH PROPOFOL ;  Surgeon: Shane Darling, MD;  Location: [facility] ENDOSCOPY;  Service: Endoscopy;  Laterality: N/A;   JOINT REPLACEMENT     LUMBAR LAMINECTOMY/DECOMPRESSION MICRODISCECTOMY N/A 06/26/2016   Procedure: LUMBAR LAMINECTOMY/DECOMPRESSION MICRODISCECTOMY 2 LEVELS L5-S1;  Surgeon: Jodeen Munch, MD;  Location: [facility] ORS;  Service: Neurosurgery;  Laterality:  N/A;   TONSILLECTOMY      Prior to Admission medications   Medication Sig Start Date End Date Taking? Authorizing Provider  allopurinol  (ZYLOPRIM ) 300 MG tablet Take 300 mg by mouth daily.   Yes [provider]  ALPRAZolam  (XANAX ) 0.25 MG tablet Take by mouth as needed. 09/06/20  Yes [provider]  cholecalciferol (VITAMIN D3) 25 MCG (1000 UNIT) tablet Take 1,000 Units by mouth daily.   Yes [provider]  cyanocobalamin (VITAMIN B12) 1000 MCG tablet Take by mouth.   Yes [provider]  gabapentin  (NEURONTIN ) 100 MG capsule Take 200 mg by mouth at bedtime.   Yes [provider]  latanoprost (XALATAN) 0.005 % ophthalmic solution SMARTSIG:In Eye(s) 07/03/23  Yes [provider]  magnesium oxide (MAG-OX) 400 (240 Mg) MG tablet Take 400 mg by mouth daily.   Yes [provider]  Multiple Vitamins-Minerals (ICAPS AREDS 2 PO) Take 2 capsules by mouth daily.   Yes [provider]    Allergies as of 09/25/2023 - Review Complete 07/22/2023  Allergen Reaction Noted   Levaquin [levofloxacin] Other (See Comments) and Anxiety 04/06/2015    Family History  Problem Relation Age of Onset   Pancreatic cancer Mother 49   Stomach cancer Maternal Aunt        d. 73   Cancer Maternal Uncle        x2 two mat uncles (identical twins), dx 38s   Lung cancer Paternal Uncle        d. 74s; smoking hx   Ovarian cancer Maternal Grandmother  dx 29s   Cancer Cousin        unknown type; maternal male cousin; dx after 71   Breast cancer Cousin        pat male cousin; dx before 1    Social History   Socioeconomic History   Marital status: Married    Spouse name: Not on file   Number of children: Not on file   Years of education: Not on file   Highest education level: Not on file  Occupational History   Not on file  Tobacco Use   Smoking status: Former    Current packs/day: 0.00    Average packs/day: 1.5 packs/day for  5.8 years (8.7 ttl pk-yrs)    Types: Cigarettes    Start date: 30    Quit date: 04/10/1968    Years since quitting: 55.5   Smokeless tobacco: Never  Vaping Use   Vaping status: Never Used  Substance and Sexual Activity   Alcohol use: Yes    Alcohol/week: 3.0 standard drinks of alcohol    Types: 3 Cans of beer per week    Comment: social   Drug use: No   Sexual activity: Not on file  Other Topics Concern   Not on file  Social History Narrative   Not on file   Social Drivers of Health   Financial Resource Strain: Low Risk  (07/15/2023)   Received from [facility]   Overall Financial Resource Strain (CARDIA)    Difficulty of Paying Living Expenses: Not hard at all  Food Insecurity: No Food Insecurity (07/15/2023)   Received from [facility]   Hunger Vital Sign    Worried About Running Out of Food in the Last Year: Never true    Ran Out of Food in the Last Year: Never true  Transportation Needs: No Transportation Needs (07/15/2023)   Received from [facility] - Transportation    In the past 12 months, has lack of transportation kept you from medical appointments or from getting medications?: No    Lack of Transportation (Non-Medical): No  Physical Activity: Not on file  Stress: Not on file  Social Connections: Not on file  Intimate Partner Violence: Not on file    Review of Systems: See HPI, otherwise negative ROS  Physical Exam: BP (!) 157/83   Pulse 92   Temp 97.7 F (36.5 C) (Temporal)   Ht 5\' 10"  (1.778 m)   Wt 86.1 kg   SpO2 94%   BMI 27.25 kg/m  General:   Alert,  pleasant and cooperative in NAD Head:  Normocephalic and atraumatic. Lungs:  Clear to auscultation.    Heart:  Regular rate and rhythm.   Impression/Plan: Joe Tyler is here for ophthalmic surgery.  Risks, benefits, limitations, and alternatives regarding ophthalmic surgery have been reviewed with the patient.  Questions  have been answered.  All parties agreeable.   Annell Kidney, MD  10/15/2023, 7:33 AM

## 2023-10-15 NOTE — Anesthesia Postprocedure Evaluation (Signed)
 Anesthesia Post Note  Patient: Joe Tyler  Procedure(s) Performed: PHACOEMULSIFICATION, CATARACT, WITH IOL INSERTION 7.54 00:34.1 (Left: Eye)  Patient location during evaluation: PACU Anesthesia Type: MAC Level of consciousness: awake and alert Pain management: pain level controlled Vital Signs Assessment: post-procedure vital signs reviewed and stable Respiratory status: spontaneous breathing, nonlabored ventilation, respiratory function stable and patient connected to nasal cannula oxygen Cardiovascular status: stable and blood pressure returned to baseline Postop Assessment: no apparent nausea or vomiting Anesthetic complications: no   No notable events documented.   Last Vitals:  Vitals:   10/15/23 0820 10/15/23 0825  BP: 119/70 118/70  Pulse: 83 81  Resp: 14 17  Temp:  (!) 36.4 C  SpO2: 97% 100%    Last Pain:  Vitals:   10/15/23 0825  TempSrc:   PainSc: 0-No pain                 Keerstin Bjelland C Chaniqua Brisby

## 2023-10-15 NOTE — Transfer of Care (Signed)
 Immediate Anesthesia Transfer of Care Note  Patient: Joe Tyler  Procedure(s) Performed: PHACOEMULSIFICATION, CATARACT, WITH IOL INSERTION 7.54 00:34.1 (Left: Eye)  Patient Location: PACU  Anesthesia Type:MAC  Level of Consciousness: awake, alert , and oriented  Airway & Oxygen Therapy: Patient Spontanous Breathing  Post-op Assessment: Report given to RN and Post -op Vital signs reviewed and stable  Post vital signs: Reviewed and stable  Last Vitals:  Vitals Value Taken Time  BP 119/70 10/15/23 0819  Temp 36.4 C 10/15/23 0819  Pulse 83 10/15/23 0820  Resp 14 10/15/23 0820  SpO2 97 % 10/15/23 0820  Vitals shown include unfiled device data.  Last Pain:  Vitals:   10/15/23 0819  TempSrc:   PainSc: 0-No pain         Complications: No notable events documented.

## 2023-10-15 NOTE — Op Note (Signed)
 OPERATIVE NOTE  Joe Tyler [id number] 10/15/2023   PREOPERATIVE DIAGNOSIS:  Nuclear sclerotic cataract left eye. H25.12   POSTOPERATIVE DIAGNOSIS:    Nuclear sclerotic cataract left eye.     PROCEDURE:  Phacoemusification with posterior chamber intraocular lens placement of the left eye  Ultrasound time: Procedure(s): PHACOEMULSIFICATION, CATARACT, WITH IOL INSERTION 7.54 00:34.1 (Left)  LENS:   Implant Name Type Inv. Item Serial No. Manufacturer Lot No. LRB No. Used Action  LENS IOL TECNIS EYHANCE 19.0 - [id number] Intraocular Lens LENS IOL TECNIS EYHANCE 19.0 [id number] [employer]  Left 1 Implanted      SURGEON:  Berline Brenner, MD   ANESTHESIA:  Topical with tetracaine drops and 2% Xylocaine  jelly, augmented with 1% preservative-free intracameral lidocaine .    COMPLICATIONS:  None.   DESCRIPTION OF PROCEDURE:  The patient was identified in the holding room and transported to the operating room and placed in the supine position under the operating microscope.  The left eye was identified as the operative eye and it was prepped and draped in the usual sterile ophthalmic fashion.   A 1 millimeter clear-corneal paracentesis was made at the 1:30 position.  0.5 ml of preservative-free 1% lidocaine  was injected into the anterior chamber.  The anterior chamber was filled with Viscoat viscoelastic.  A 2.4 millimeter keratome was used to make a near-clear corneal incision at the 10:30 position.  .  A curvilinear capsulorrhexis was made with a cystotome and capsulorrhexis forceps.  Balanced salt solution was used to hydrodissect and hydrodelineate the nucleus.   Phacoemulsification was then used in stop and chop fashion to remove the lens nucleus and epinucleus.  The remaining cortex was then removed using the irrigation and aspiration handpiece. Provisc was then placed into the capsular bag to distend it for lens placement.  A lens was then injected into the capsular bag.  The  remaining viscoelastic was aspirated.   Wounds were hydrated with balanced salt solution.  The anterior chamber was inflated to a physiologic pressure with balanced salt solution.  No wound leaks were noted. Cefuroxime 0.1 ml of a 10mg /ml solution was injected into the anterior chamber for a dose of 1 mg of intracameral antibiotic at the completion of the case.   Timolol and Brimonidine drops were applied to the eye.  The patient was taken to the recovery room in stable condition without complications of anesthesia or surgery.  [name] 10/15/2023, 8:18 AM

## 2023-10-28 NOTE — Anesthesia Preprocedure Evaluation (Signed)
 Anesthesia Evaluation  Patient identified by MRN, date of birth, ID band Patient awake    Reviewed: Allergy & Precautions, H&P , NPO status , Patient's Chart, lab work & pertinent test results  Airway Mallampati: III  TM Distance: >3 FB Neck ROM: Full    Dental no notable dental hx.  Permanent left upper bridge: :   Pulmonary neg pulmonary ROS, former smoker   Pulmonary exam normal breath sounds clear to auscultation       Cardiovascular + Peripheral Vascular Disease  negative cardio ROS Normal cardiovascular exam Rhythm:Regular Rate:Normal  06-20-16 stress echo INTERPRETATION  Normal Stress Echocardiogram  TRIVIAL REGURGITATION NOTED (MR, TR, PR, AR) NO VALVULAR STENOSIS NOTED         Neuro/Psych   Anxiety      Neuromuscular disease negative neurological ROS  negative psych ROS   GI/Hepatic negative GI ROS, Neg liver ROS,,,  Endo/Other  negative endocrine ROSdiabetes    Renal/GU negative Renal ROS  negative genitourinary   Musculoskeletal negative musculoskeletal ROS (+) Arthritis ,    Abdominal   Peds negative pediatric ROS (+)  Hematology negative hematology ROS (+)   Anesthesia Other Findings Previous cataract surgery 10-15-23  Diabetes mellitus without complication (HCC)   Hyperlipidemia GoutAnxiety Peutz-Jeghers syndrome---rare, inherited disorder, mucocunateous pigmentation mucous membranes, esp mouth and nose, often around nose and eyes, dark blue, brown or black spots, may fade hamartomatous (benign) polyps GI, abdominal pain,  intussesception, rectal prolapse, gynecomastia in males, GI bleed, bowel obstruction, clubbing of fingers and toes,  increased risk of certain cancers       Arthritis Shingles Spinal stenosis Actinic keratosisHistory of colonic polyps Family history of breast cancer Family history of pancreatic cancer Wears hearing aid in both ears Motion sickness PVD (peripheral  vascular disease)    Reproductive/Obstetrics negative OB ROS                             Anesthesia Physical Anesthesia Plan  ASA: 2  Anesthesia Plan: MAC   Post-op Pain Management:    Induction: Intravenous  PONV Risk Score and Plan:   Airway Management Planned: Natural Airway and Nasal Cannula  Additional Equipment:   Intra-op Plan:   Post-operative Plan:   Informed Consent: I have reviewed the patients History and Physical, chart, labs and discussed the procedure including the risks, benefits and alternatives for the proposed anesthesia with the patient or authorized representative who has indicated his/her understanding and acceptance.     Dental Advisory Given  Plan Discussed with: Anesthesiologist, CRNA and Surgeon  Anesthesia Plan Comments: (Patient consented for risks of anesthesia including but not limited to:  - adverse reactions to medications - damage to eyes, teeth, lips or other oral mucosa - nerve damage due to positioning  - sore throat or hoarseness - Damage to heart, brain, nerves, lungs, other parts of body or loss of life  Patient voiced understanding and assent.)        Anesthesia Quick Evaluation

## 2023-10-28 NOTE — Discharge Instructions (Signed)

## 2023-10-29 ENCOUNTER — Encounter: Payer: Self-pay | Admitting: Ophthalmology

## 2023-10-29 ENCOUNTER — Encounter
Admission: RE | Disposition: A | Discharge status: Home / Self Care | Payer: Self-pay | Source: Home / Self Care | Attending: Ophthalmology

## 2023-10-29 ENCOUNTER — Ambulatory Visit: Payer: Self-pay | Admitting: Anesthesiology

## 2023-10-29 ENCOUNTER — Other Ambulatory Visit: Payer: Self-pay

## 2023-10-29 ENCOUNTER — Ambulatory Visit: Payer: BLUE CROSS/BLUE SHIELD | Admitting: Dermatology

## 2023-10-29 ENCOUNTER — Ambulatory Visit
Admission: RE | Admit: 2023-10-29 | Discharge: 2023-10-29 | Disposition: A | Discharge status: Home / Self Care | Attending: Ophthalmology | Admitting: Ophthalmology

## 2023-10-29 DIAGNOSIS — Z87891 Personal history of nicotine dependence: Secondary | ICD-10-CM | POA: Insufficient documentation

## 2023-10-29 DIAGNOSIS — H2511 Age-related nuclear cataract, right eye: Secondary | ICD-10-CM | POA: Insufficient documentation

## 2023-10-29 DIAGNOSIS — E1136 Type 2 diabetes mellitus with diabetic cataract: Secondary | ICD-10-CM | POA: Diagnosis not present

## 2023-10-29 HISTORY — PX: CATARACT EXTRACTION W/PHACO: SHX586

## 2023-10-29 SURGERY — PHACOEMULSIFICATION, CATARACT, WITH IOL INSERTION
Anesthesia: Monitor Anesthesia Care | Site: Eye | Laterality: Right

## 2023-10-29 MED ORDER — SIGHTPATH DOSE#1 BSS IO SOLN
INTRAOCULAR | Status: DC | PRN
  Administered 2023-10-29: 15 mL via INTRAOCULAR

## 2023-10-29 MED ORDER — LIDOCAINE HCL (PF) 2 % IJ SOLN
INTRAOCULAR | Status: DC | PRN
  Administered 2023-10-29: 2 mL

## 2023-10-29 MED ORDER — TETRACAINE HCL 0.5 % OP SOLN
1.0000 [drp] | OPHTHALMIC | Status: DC | PRN
Start: 2023-10-29 — End: 2023-10-29
  Administered 2023-10-29 (×3): 1 [drp] via OPHTHALMIC

## 2023-10-29 MED ORDER — FENTANYL CITRATE (PF) 100 MCG/2ML IJ SOLN
INTRAMUSCULAR | Status: DC | PRN
  Administered 2023-10-29: 25 ug via INTRAVENOUS

## 2023-10-29 MED ORDER — BRIMONIDINE TARTRATE-TIMOLOL 0.2-0.5 % OP SOLN
OPHTHALMIC | Status: DC | PRN
  Administered 2023-10-29: 1 [drp] via OPHTHALMIC

## 2023-10-29 MED ORDER — MIDAZOLAM HCL 2 MG/2ML IJ SOLN
INTRAMUSCULAR | Status: AC
  Filled 2023-10-29: qty 2

## 2023-10-29 MED ORDER — MIDAZOLAM HCL 2 MG/2ML IJ SOLN
INTRAMUSCULAR | Status: DC | PRN
  Administered 2023-10-29 (×2): 1 mg via INTRAVENOUS

## 2023-10-29 MED ORDER — CEFUROXIME OPHTHALMIC INJECTION 1 MG/0.1 ML
INJECTION | OPHTHALMIC | Status: DC | PRN
  Administered 2023-10-29: 1 mg via INTRACAMERAL

## 2023-10-29 MED ORDER — ARMC OPHTHALMIC DILATING DROPS
1.0000 | OPHTHALMIC | Status: DC | PRN
  Administered 2023-10-29 (×3): 1 via OPHTHALMIC

## 2023-10-29 MED ORDER — TETRACAINE HCL 0.5 % OP SOLN
OPHTHALMIC | Status: AC
  Filled 2023-10-29: qty 4

## 2023-10-29 MED ORDER — SIGHTPATH DOSE#1 NA HYALUR & NA CHOND-NA HYALUR IO KIT
PACK | INTRAOCULAR | Status: DC | PRN
  Administered 2023-10-29: 1 via OPHTHALMIC

## 2023-10-29 MED ORDER — SIGHTPATH DOSE#1 BSS IO SOLN
INTRAOCULAR | Status: DC | PRN
  Administered 2023-10-29: 46 mL via OPHTHALMIC

## 2023-10-29 MED ORDER — ARMC OPHTHALMIC DILATING DROPS
OPHTHALMIC | Status: AC
  Filled 2023-10-29: qty 0.5

## 2023-10-29 MED ORDER — FENTANYL CITRATE (PF) 100 MCG/2ML IJ SOLN
INTRAMUSCULAR | Status: AC
  Filled 2023-10-29: qty 2

## 2023-10-29 SURGICAL SUPPLY — 10 items
CATARACT SUITE SIGHTPATH (MISCELLANEOUS) ×1 IMPLANT
FEE CATARACT SUITE SIGHTPATH (MISCELLANEOUS) ×1 IMPLANT
GLOVE BIOGEL PI IND STRL 8 (GLOVE) ×1 IMPLANT
GLOVE SURG LX STRL 7.5 STRW (GLOVE) ×1 IMPLANT
GLOVE SURG PROTEXIS BL SZ6.5 (GLOVE) ×1 IMPLANT
GLOVE SURG SYN 6.5 PF PI BL (GLOVE) ×1 IMPLANT
LENS IOL TECNIS EYHANCE 20.5 (Intraocular Lens) IMPLANT
NDL FILTER BLUNT 18X1 1/2 (NEEDLE) ×1 IMPLANT
NEEDLE FILTER BLUNT 18X1 1/2 (NEEDLE) ×1 IMPLANT
SYR 3ML LL SCALE MARK (SYRINGE) ×1 IMPLANT

## 2023-10-29 NOTE — Transfer of Care (Signed)
 Immediate Anesthesia Transfer of Care Note  Patient: Joe Tyler  Procedure(s) Performed: PHACOEMULSIFICATION, CATARACT, WITH IOL INSERTION 6.00 00:29.5 (Right: Eye)  Patient Location: PACU  Anesthesia Type: MAC  Level of Consciousness: awake, alert  and patient cooperative  Airway and Oxygen Therapy: Patient Spontanous Breathing and Patient connected to supplemental oxygen  Post-op Assessment: Post-op Vital signs reviewed, Patient's Cardiovascular Status Stable, Respiratory Function Stable, Patent Airway and No signs of Nausea or vomiting  Post-op Vital Signs: Reviewed and stable  Complications: No notable events documented.

## 2023-10-29 NOTE — H&P (Signed)
 Joe Tyler   Primary Care Physician:  Little Riff, MD Ophthalmologist: Dr. Annell Kidney  Pre-Procedure History & Physical: HPI:  Joe Tyler is a 83 y.o. male here for ophthalmic surgery.   Past Medical History:  Diagnosis Date   Actinic keratosis    Hx PDT   Anxiety    Panic Attack   Arthritis    Diabetes mellitus without complication (HCC)    no meds   Family history of breast cancer 04/04/2022   Family history of pancreatic cancer 04/04/2022   Gout    History of colonic polyps 04/04/2022   Hyperlipidemia    Motion sickness    ocean boats   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HCC)    PVD (peripheral vascular disease) (HCC)    Shingles    Spinal stenosis    Wears hearing aid in both ears     Past Surgical History:  Procedure Laterality Date   benign tumor removed Right arm   BILATERAL HIP ARTHROSCOPY     CATARACT EXTRACTION W/PHACO Left 10/15/2023   Procedure: PHACOEMULSIFICATION, CATARACT, WITH IOL INSERTION 7.54 00:34.1;  Surgeon: Annell Kidney, MD;  Location: [facility];  Service: Ophthalmology;  Laterality: Left;   COLONOSCOPY WITH PROPOFOL  N/A 04/07/2015   Procedure: COLONOSCOPY WITH PROPOFOL ;  Surgeon: Cassie Click, MD;  Location: [facility] ENDOSCOPY;  Service: Endoscopy;  Laterality: N/A;   COLONOSCOPY WITH PROPOFOL  N/A 05/13/2022   Procedure: COLONOSCOPY WITH PROPOFOL ;  Surgeon: Shane Darling, MD;  Location: [facility] ENDOSCOPY;  Service: Endoscopy;  Laterality: N/A;   ESOPHAGOGASTRODUODENOSCOPY (EGD) WITH PROPOFOL  N/A 04/07/2015   Procedure: ESOPHAGOGASTRODUODENOSCOPY (EGD) WITH PROPOFOL ;  Surgeon: Cassie Click, MD;  Location: [facility] ENDOSCOPY;  Service: Endoscopy;  Laterality: N/A;   ESOPHAGOGASTRODUODENOSCOPY (EGD) WITH PROPOFOL  N/A 05/13/2022   Procedure: ESOPHAGOGASTRODUODENOSCOPY (EGD) WITH PROPOFOL ;  Surgeon: Shane Darling, MD;  Location: [facility] ENDOSCOPY;  Service: Endoscopy;  Laterality: N/A;   JOINT REPLACEMENT      LUMBAR LAMINECTOMY/DECOMPRESSION MICRODISCECTOMY N/A 06/26/2016   Procedure: LUMBAR LAMINECTOMY/DECOMPRESSION MICRODISCECTOMY 2 LEVELS L5-S1;  Surgeon: Jodeen Munch, MD;  Location: [facility] ORS;  Service: Neurosurgery;  Laterality: N/A;   TONSILLECTOMY      Prior to Admission medications   Medication Sig Start Date End Date Taking? Authorizing Provider  allopurinol  (ZYLOPRIM ) 300 MG tablet Take 300 mg by mouth daily.   Yes [provider]  ALPRAZolam  (XANAX ) 0.25 MG tablet Take by mouth as needed. 09/06/20  Yes [provider]  cholecalciferol (VITAMIN D3) 25 MCG (1000 UNIT) tablet Take 1,000 Units by mouth daily.   Yes [provider]  cyanocobalamin (VITAMIN B12) 1000 MCG tablet Take by mouth.   Yes [provider]  gabapentin  (NEURONTIN ) 100 MG capsule Take 200 mg by mouth at bedtime.   Yes [provider]  latanoprost (XALATAN) 0.005 % ophthalmic solution SMARTSIG:In Eye(s) 07/03/23  Yes [provider]  magnesium oxide (MAG-OX) 400 (240 Mg) MG tablet Take 400 mg by mouth daily.   Yes [provider]  Multiple Vitamins-Minerals (ICAPS AREDS 2 PO) Take 2 capsules by mouth daily.   Yes [provider]    Allergies as of 09/25/2023 - Review Complete 07/22/2023  Allergen Reaction Noted   Levaquin [levofloxacin] Other (See Comments) and Anxiety 04/06/2015    Family History  Problem Relation Age of Onset   Pancreatic cancer Mother 44   Stomach cancer Maternal Aunt        d. 30   Cancer Maternal Uncle  x2 two mat uncles (identical twins), dx 38s   Lung cancer Paternal Uncle        d. 74s; smoking hx   Ovarian cancer Maternal Grandmother        dx 3s   Cancer Cousin        unknown type; maternal male cousin; dx after 43   Breast cancer Cousin        pat male cousin; dx before 58    Social History   Socioeconomic History   Marital status: Married    Spouse name: Not on file   Number of children:  Not on file   Years of education: Not on file   Highest education level: Not on file  Occupational History   Not on file  Tobacco Use   Smoking status: Former    Current packs/day: 0.00    Average packs/day: 1.5 packs/day for 5.8 years (8.7 ttl pk-yrs)    Types: Cigarettes    Start date: 87    Quit date: 04/10/1968    Years since quitting: 55.5   Smokeless tobacco: Never  Vaping Use   Vaping status: Never Used  Substance and Sexual Activity   Alcohol use: Yes    Alcohol/week: 3.0 standard drinks of alcohol    Types: 3 Cans of beer per week    Comment: social   Drug use: No   Sexual activity: Not on file  Other Topics Concern   Not on file  Social History Narrative   Not on file   Social Drivers of Health   Financial Resource Strain: Low Risk  (07/15/2023)   Received from [facility]   Overall Financial Resource Strain (CARDIA)    Difficulty of Paying Living Expenses: Not hard at all  Food Insecurity: No Food Insecurity (07/15/2023)   Received from [facility]   Hunger Vital Sign    Worried About Running Out of Food in the Last Year: Never true    Ran Out of Food in the Last Year: Never true  Transportation Needs: No Transportation Needs (07/15/2023)   Received from [facility] - Transportation    In the past 12 months, has lack of transportation kept you from medical appointments or from getting medications?: No    Lack of Transportation (Non-Medical): No  Physical Activity: Not on file  Stress: Not on file  Social Connections: Not on file  Intimate Partner Violence: Not on file    Review of Systems: See HPI, otherwise negative ROS  Physical Exam: There were no vitals taken for this visit. General:   Alert,  pleasant and cooperative in NAD Head:  Normocephalic and atraumatic. Lungs:  Clear to auscultation.    Heart:  Regular rate and rhythm.   Impression/Plan: Joe Tyler is here for  ophthalmic surgery.  Risks, benefits, limitations, and alternatives regarding ophthalmic surgery have been reviewed with the patient.  Questions have been answered.  All parties agreeable.   Annell Kidney, MD  10/29/2023, 10:35 AM

## 2023-10-29 NOTE — Op Note (Signed)
 LOCATION:  [facility]   PREOPERATIVE DIAGNOSIS:    Nuclear sclerotic cataract right eye. H25.11   POSTOPERATIVE DIAGNOSIS:  Nuclear sclerotic cataract right eye.     PROCEDURE:  Phacoemusification with posterior chamber intraocular lens placement of the right eye   ULTRASOUND TIME: Procedure(s): PHACOEMULSIFICATION, CATARACT, WITH IOL INSERTION 6.00 00:29.5 (Right)  LENS:   Implant Name Type Inv. Item Serial No. Manufacturer Lot No. LRB No. Used Action  LENS IOL TECNIS EYHANCE 20.5 - [id number] Intraocular Lens LENS IOL TECNIS EYHANCE 20.5 [id number] [employer]  Right 1 Implanted         SURGEON:  Berline Brenner, MD   ANESTHESIA:  Topical with tetracaine  drops and 2% Xylocaine  jelly, augmented with 1% preservative-free intracameral lidocaine .    COMPLICATIONS:  None.   DESCRIPTION OF PROCEDURE:  The patient was identified in the holding room and transported to the operating room and placed in the supine position under the operating microscope.  The right eye was identified as the operative eye and it was prepped and draped in the usual sterile ophthalmic fashion.   A 1 millimeter clear-corneal paracentesis was made at the 12:00 position.  0.5 ml of preservative-free 1% lidocaine  was injected into the anterior chamber. The anterior chamber was filled with Viscoat viscoelastic.  A 2.4 millimeter keratome was used to make a near-clear corneal incision at the 9:00 position.  A curvilinear capsulorrhexis was made with a cystotome and capsulorrhexis forceps.  Balanced salt  solution was used to hydrodissect and hydrodelineate the nucleus.   Phacoemulsification was then used in stop and chop fashion to remove the lens nucleus and epinucleus.  The remaining cortex was then removed using the irrigation and aspiration handpiece. Provisc was then placed into the capsular bag to distend it for lens placement.  A lens was then injected into the capsular bag.  The remaining  viscoelastic was aspirated.   Wounds were hydrated with balanced salt  solution.  The anterior chamber was inflated to a physiologic pressure with balanced salt  solution.  No wound leaks were noted. Cefuroxime  0.1 ml of a 10mg /ml solution was injected into the anterior chamber for a dose of 1 mg of intracameral antibiotic at the completion of the case.   Timolol  and Brimonidine  drops and Maxitrol oniment were applied to the eye.  The patient was taken to the recovery room in stable condition without complications of anesthesia or surgery.   Joe Tyler 10/29/2023, 11:53 AM

## 2023-10-29 NOTE — Anesthesia Postprocedure Evaluation (Signed)
 Anesthesia Post Note  Patient: Joe Tyler  Procedure(s) Performed: PHACOEMULSIFICATION, CATARACT, WITH IOL INSERTION 6.00 00:29.5 (Right: Eye)  Patient location during evaluation: PACU Anesthesia Type: MAC Level of consciousness: awake and alert Pain management: pain level controlled Vital Signs Assessment: post-procedure vital signs reviewed and stable Respiratory status: spontaneous breathing, nonlabored ventilation, respiratory function stable and patient connected to nasal cannula oxygen Cardiovascular status: stable and blood pressure returned to baseline Postop Assessment: no apparent nausea or vomiting Anesthetic complications: no   No notable events documented.   Last Vitals:  Vitals:   10/29/23 1156 10/29/23 1201  BP: 116/75 113/76  Pulse: 80 84  Resp: 16   Temp: (!) 36.2 C (!) 36.2 C  SpO2: 95%     Last Pain:  Vitals:   10/29/23 1201  TempSrc:   PainSc: 0-No pain                 Hollin Crewe C Murel Shenberger

## 2023-10-30 ENCOUNTER — Encounter: Payer: Self-pay | Admitting: Ophthalmology

## 2023-11-04 ENCOUNTER — Encounter (INDEPENDENT_AMBULATORY_CARE_PROVIDER_SITE_OTHER): Payer: Self-pay

## 2023-12-04 ENCOUNTER — Other Ambulatory Visit: Payer: Self-pay | Admitting: Internal Medicine

## 2023-12-04 ENCOUNTER — Ambulatory Visit
Admission: RE | Admit: 2023-12-04 | Discharge: 2023-12-04 | Disposition: A | Discharge status: Home / Self Care | Source: Ambulatory Visit | Attending: Internal Medicine | Admitting: Internal Medicine

## 2023-12-04 DIAGNOSIS — R109 Unspecified abdominal pain: Secondary | ICD-10-CM | POA: Insufficient documentation

## 2023-12-09 ENCOUNTER — Ambulatory Visit: Admitting: Dermatology

## 2023-12-09 ENCOUNTER — Encounter: Payer: Self-pay | Admitting: Dermatology

## 2023-12-09 DIAGNOSIS — D692 Other nonthrombocytopenic purpura: Secondary | ICD-10-CM

## 2023-12-09 DIAGNOSIS — L570 Actinic keratosis: Secondary | ICD-10-CM

## 2023-12-09 DIAGNOSIS — W908XXA Exposure to other nonionizing radiation, initial encounter: Secondary | ICD-10-CM

## 2023-12-09 DIAGNOSIS — Z5111 Encounter for antineoplastic chemotherapy: Secondary | ICD-10-CM | POA: Diagnosis not present

## 2023-12-09 DIAGNOSIS — Z7189 Other specified counseling: Secondary | ICD-10-CM

## 2023-12-09 DIAGNOSIS — L820 Inflamed seborrheic keratosis: Secondary | ICD-10-CM

## 2023-12-09 DIAGNOSIS — L578 Other skin changes due to chronic exposure to nonionizing radiation: Secondary | ICD-10-CM

## 2023-12-09 DIAGNOSIS — L821 Other seborrheic keratosis: Secondary | ICD-10-CM

## 2023-12-09 DIAGNOSIS — Z79899 Other long term (current) drug therapy: Secondary | ICD-10-CM

## 2023-12-09 MED ORDER — FLUOROURACIL 5 % EX CREA: TOPICAL_CREAM | Freq: Two times a day (BID) | CUTANEOUS | 1 refills | Status: DC

## 2023-12-09 NOTE — Progress Notes (Signed)
 Follow-Up Visit   Subjective  Joe Tyler is a 83 y.o. male who presents for the following: AK follow up - recheck sun exposed areas for new or persistent lesions.  The following portions of the chart were reviewed this encounter and updated as appropriate: medications, allergies, medical history  Review of Systems:  No other skin or systemic complaints except as noted in HPI or Assessment and Plan.  Objective  Well appearing patient in no apparent distress; mood and affect are within normal limits.   A focused examination was performed of the following areas: the face, scalp, trunk, arms, and hands   Relevant exam findings are noted in the Assessment and Plan.  scalp/face/arms/hands x 33 (33) Pink scaly macules R tricep near the elbow x 3, R side  x 1, L side x 3 (7) Stuck on waxy paps with erythema  Assessment & Plan   Purpura - Chronic; persistent and recurrent.  Treatable, but not curable. - Violaceous macules and patches - Benign - Related to trauma, age, sun damage and/or use of blood thinners, chronic use of topical and/or oral steroids - Observe - Can use OTC arnica containing moisturizer such as Dermend Bruise Formula if desired - Call for worsening or other concerns  SEBORRHEIC KERATOSIS - Stuck-on, waxy, tan-brown papules and/or plaques  - Benign-appearing - Discussed benign etiology and prognosis. 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 - diffuse scaly erythematous macules and papules with underlying dyspigmentation - Discussed Prescription Field Treatment topical Chemotherapy for Severe, Chronic  Confluent Actinic Changes with Pre-Cancerous Actinic Keratoses Field treatment involves treatment of an entire area of skin that has confluent Actinic Changes (Sun/ Ultraviolet light damage) and PreCancerous Actinic Keratoses by method of PhotoDynamic Therapy (PDT) and/or prescription Topical Chemotherapy agents such as 5-fluorouracil , 5-fluorouracil /calcipotriene, and/or imiquimod.  The purpose is to decrease the number of clinically evident and subclinical PreCancerous lesions to prevent progression to development of skin cancer by chemically destroying early precancer changes that may or may not be visible.  It has been shown to reduce the risk of developing skin cancer in the treated area. As a result of treatment, redness, scaling, crusting, and open sores may occur during treatment course. One or more than one of these methods may be used and may have to be used several times to control, suppress and eliminate the PreCancerous changes. Discussed treatment course, expected reaction, and possible side effects. - Recommend daily broad spectrum sunscreen SPF 30+ to sun-exposed areas, reapply every 2 hours as needed.  - Staying in the shade or wearing long sleeves, sun glasses (UVA+UVB protection) and wide brim hats (4-inch brim around the entire circumference of the hat) are also recommended. - Call for new or changing lesions. - Restart 5FU/Calcipotriene mix BID x 10 days to the scalp and arms.  Destruction of lesion - scalp/face/arms/hands x 33 (33) Complexity: simple   Destruction method: cryotherapy   Informed consent: discussed and consent obtained   Timeout:  patient name, date of birth, surgical site, and procedure verified Lesion destroyed using liquid nitrogen: Yes   Region frozen until ice ball extended beyond lesion: Yes   Outcome: patient tolerated procedure well with no complications   Post-procedure details:  wound care instructions given   INFLAMED SEBORRHEIC KERATOSIS (7) R tricep  near the elbow x 3, R side  x 1, L side x 3 (7) Symptomatic, irritating, patient would like treated. Destruction of lesion - R tricep near the elbow x 3, R side  x 1, L side x 3 (7) Complexity: simple   Destruction method: cryotherapy   Informed consent: discussed and consent obtained   Timeout:  patient name, date of birth, surgical site, and procedure verified Lesion destroyed using liquid nitrogen: Yes   Region frozen until ice ball extended beyond lesion: Yes   Outcome: patient tolerated procedure well with no complications   Post-procedure details: wound care instructions given    Return in about 8 months (around 08/10/2024) for AK follow up.  LILLETTE Rosina Mayans, CMA, am acting as scribe for Alm Rhyme, MD .   Documentation: I have reviewed the above documentation for accuracy and completeness, and I agree with the above.  Alm Rhyme, MD

## 2023-12-09 NOTE — Patient Instructions (Addendum)
 Instructions for [facility] Medications  One or more of your medications was sent to the [facility] mail order compounding pharmacy. You will receive an email from them and can purchase the medicine through that link. It will then be mailed to your home at the address you confirmed. If for any reason you do not receive an email from them, please check your spam folder. If you still do not find the email, please let us know. [facility] phone number is [phone].       Due to recent changes in healthcare laws, you may see results of your pathology and/or laboratory studies on MyChart before the doctors have had a chance to review them. We understand that in some cases there may be results that are confusing or concerning to you. Please understand that not all results are received at the same time and often the doctors may need to interpret multiple results in order to provide you with the best plan of care or course of treatment. Therefore, we ask that you please give [place] 2 business days to thoroughly review all your results before contacting the office for clarification. Should we see a critical lab result, you will be contacted sooner.   If You Need Anything After Your Visit  If you have any questions or concerns for your doctor, please call our main line at [phone] and press option 4 to reach your doctor's medical assistant. If no one answers, please leave a voicemail as directed and we will return your call as soon as possible. Messages left after 4 pm will be answered the following business day.   You may also send [place] a message via MyChart. We typically respond to MyChart messages within 1-2 business days.  For prescription refills, please ask your pharmacy to contact our office. Our fax number is [phone].  If you have an urgent issue when the clinic is closed that cannot wait until the next business day, you can page your doctor at the number below.    Please note  that while we do our best to be available for urgent issues outside of office hours, we are not available 24/7.   If you have an urgent issue and are unable to reach [place], you may choose to seek medical care at your doctor's office, retail clinic, urgent care center, or emergency room.  If you have a medical emergency, please immediately call 911 or go to the emergency department.  Pager Numbers  - Dr. Gwen Pounds: [phone]  - Dr. Roseanne Reno: [phone]  - Dr. Katrinka Blazing: [phone]   In the event of inclement weather, please call our main line at [phone] for an update on the status of any delays or closures.  Dermatology Medication Tips: Please keep the boxes that topical medications come in in order to help keep track of the instructions about where and how to use these. Pharmacies typically print the medication instructions only on the boxes and not directly on the medication tubes.   If your medication is too expensive, please contact our office at [phone] option 4 or send [place] a message through MyChart.   We are unable to tell what your co-pay for medications will be in advance as this is different depending on your insurance coverage. However, we may be able to find a substitute medication at lower cost or fill out paperwork to get insurance to cover a needed medication.   If a prior authorization is required to get your medication covered by your  insurance company, please allow [place] 1-2 business days to complete this process.  Drug prices often vary depending on where the prescription is filled and some pharmacies may offer cheaper prices.  The website www.goodrx.com contains coupons for medications through different pharmacies. The prices here do not account for what the cost may be with help from insurance (it may be cheaper with your insurance), but the website can give you the price if you did not use any insurance.  - You can print the associated coupon and take it with your  prescription to the pharmacy.  - You may also stop by our office during regular business hours and pick up a GoodRx coupon card.  - If you need your prescription sent electronically to a different pharmacy, notify our office through [facility] or by phone at [phone] option 4.     Si Usted Necesita Algo Despus de Su Visita  Tambin puede enviarnos un mensaje a travs de Clinical cytogeneticist. Por lo general respondemos a los mensajes de MyChart en el transcurso de 1 a 2 das hbiles.  Para renovar recetas, por favor pida a su farmacia que se ponga en contacto con nuestra oficina. Annie Sable de fax es [place] [phone].  Si tiene un asunto urgente cuando la clnica est cerrada y que no puede esperar hasta el siguiente da hbil, puede llamar/localizar a su doctor(a) al nmero que aparece a continuacin.   Por favor, tenga en cuenta que aunque hacemos todo lo posible para estar disponibles para asuntos urgentes fuera del horario de [place], no estamos disponibles las 24 horas del da, los 7 [address]  [address] de la [place].   Si tiene un problema urgente y no puede comunicarse con nosotros, puede optar por buscar atencin mdica  en el consultorio de su doctor(a), en una clnica privada, en un centro de atencin urgente o en una sala de emergencias.  Si tiene Engineer, drilling, por favor llame inmediatamente al 911 o vaya a la sala de emergencias.  Nmeros de bper  - Dr. Gwen Pounds: [phone]  - Dra. Roseanne Reno: [phone]  - Dr. Katrinka Blazing: [phone]   En caso de inclemencias del tiempo, por favor llame a Lacy Duverney principal al [phone] para una actualizacin sobre el [place] de cualquier retraso o cierre.  Consejos para la medicacin en dermatologa: Por favor, guarde las cajas en las que vienen los medicamentos de uso tpico para ayudarle a seguir las instrucciones sobre dnde y cmo usarlos. Las farmacias generalmente imprimen las instrucciones del medicamento slo en las cajas y no  directamente en los tubos del [place].   Si su medicamento es muy caro, por favor, pngase en contacto con Rolm Gala llamando al [phone] y presione la opcin 4 o envenos un mensaje a travs de Clinical cytogeneticist.   No podemos decirle cul ser su copago por los medicamentos por adelantado ya que esto es diferente dependiendo de la cobertura de su seguro. Sin embargo, es posible que podamos encontrar un medicamento sustituto a Audiological scientist un formulario para que el seguro cubra el medicamento que se considera necesario.   Si se requiere una autorizacin previa para que su compaa de seguros [place] su medicamento, por favor permtanos de 1 a 2 das hbiles para completar [address]  Los precios de los medicamentos varan con frecuencia dependiendo del Environmental consultant de dnde se surte la receta y alguna farmacias pueden ofrecer precios ms baratos.  El sitio web www.goodrx.com tiene cupones para medicamentos de Health and safety inspector. Los precios aqu no tienen  en cuenta lo que podra costar con la ayuda del seguro (puede ser ms barato con su seguro), pero el sitio web puede darle el precio si no Visual merchandiser.  - Puede imprimir el cupn correspondiente y llevarlo con su receta a la farmacia.  - Tambin puede pasar por nuestra oficina durante el horario de atencin regular y Education officer, museum una tarjeta de cupones de GoodRx.  - Si necesita que su receta se enve electrnicamente a una farmacia diferente, informe a nuestra oficina a travs de MyChart de [place] o por telfono llamando al [phone] y presione la opcin 4.

## 2024-01-09 ENCOUNTER — Other Ambulatory Visit: Payer: Self-pay | Admitting: Orthopedic Surgery

## 2024-01-09 DIAGNOSIS — M25511 Pain in right shoulder: Secondary | ICD-10-CM

## 2024-01-28 ENCOUNTER — Ambulatory Visit
Admission: RE | Admit: 2024-01-28 | Discharge: 2024-01-28 | Disposition: A | Discharge status: Home / Self Care | Source: Ambulatory Visit | Attending: Orthopedic Surgery | Admitting: Orthopedic Surgery

## 2024-01-28 DIAGNOSIS — M25511 Pain in right shoulder: Secondary | ICD-10-CM | POA: Insufficient documentation

## 2024-03-26 ENCOUNTER — Other Ambulatory Visit: Payer: Self-pay | Admitting: Family Medicine

## 2024-03-26 ENCOUNTER — Inpatient Hospital Stay
Admission: RE | Admit: 2024-03-26 | Discharge: 2024-03-26 | Disposition: A | Discharge status: Home / Self Care | Payer: Self-pay | Source: Ambulatory Visit | Attending: Physician Assistant | Admitting: Physician Assistant

## 2024-03-26 DIAGNOSIS — Z049 Encounter for examination and observation for unspecified reason: Secondary | ICD-10-CM

## 2024-03-31 ENCOUNTER — Encounter: Payer: Self-pay | Admitting: Physician Assistant

## 2024-03-31 NOTE — Progress Notes (Unsigned)
 Referring Physician:  Rudolpho Norleen BIRCH, MD [address] [facility] [place],  [place] [address]  Primary Physician:  Rudolpho Norleen BIRCH, MD  History of Present Illness: 04/07/2024 Joe Tyler is here today with a chief complaint of acute on chronic back pain that has become worse over the past 2 months.  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  He previously underwent a lumbar decompression and microdiscectomy in 2018.  Pain is primarily in his tailbone and 1 side is not worse than the other.  He denies any radiation down his legs.  He has done physical therapy in the past.   Weakness: none  Bowel/Bladder Dysfunction: none  Conservative measures:  Physical therapy: Has not participated in since 2024. Multimodal medical therapy including regular antiinflammatories: Gabapentin , Robaxin  Injections: None  -s/p right L5-S1 and S1 TFESI 11/20/2023 with no significant relief  -s/p right S1 TFESI 10/13/2023 with 50% relief -s/p bilateral L3, L4 medial branch and dorsal rami RFA 02/20/2022 with 80% relief -s/p bilateral S1 TFESI 11/29/2021 with 1 day of relief  -s/p bilateral SI joint injections 02/14/2023 with 90% improvement -s/p bilateral SI joint injections 06/02/2023 with 50-70% improvement -s/p bilateral SI joint injections 09/11/2023 with 70% improvement  -s/p left C4-5 TFESI 06/29/2021 with minimal relief -s/p right C5-6 TFESI 07/23/2021 with minimal relief   Past Surgery: 06/26/2016-LUMBAR LAMINECTOMY/DECOMPRESSION MICRODISCECTOMY   Joe Tyler has no symptoms of cervical myelopathy.  The symptoms are causing a significant impact on the patient's life.   Review of Systems:  A 10 point review of systems is negative, except for the pertinent positives and negatives detailed in the HPI.  Past Medical History: Past Medical History:  Diagnosis Date   Actinic keratosis    Hx PDT   Anxiety    Panic Attack   Arthritis    Diabetes mellitus without  complication (HCC)    no meds   Family history of breast cancer 04/04/2022   Family history of pancreatic cancer 04/04/2022   Gout    History of colonic polyps 04/04/2022   Hyperlipidemia    Motion sickness    ocean boats   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HCC)    PVD (peripheral vascular disease)    Shingles    Spinal stenosis    Wears hearing aid in both ears     Past Surgical History: Past Surgical History:  Procedure Laterality Date   benign tumor removed Right arm   BILATERAL HIP ARTHROSCOPY     CATARACT EXTRACTION W/PHACO Left 10/15/2023   Procedure: PHACOEMULSIFICATION, CATARACT, WITH IOL INSERTION 7.54 00:34.1;  Surgeon: Mittie Gaskin, MD;  Location: [facility];  Service: Ophthalmology;  Laterality: Left;   CATARACT EXTRACTION W/PHACO Right 10/29/2023   Procedure: PHACOEMULSIFICATION, CATARACT, WITH IOL INSERTION 6.00 00:29.5;  Surgeon: Mittie Gaskin, MD;  Location: [facility];  Service: Ophthalmology;  Laterality: Right;   COLONOSCOPY WITH PROPOFOL  N/A 04/07/2015   Procedure: COLONOSCOPY WITH PROPOFOL ;  Surgeon: Lamar ONEIDA Holmes, MD;  Location: [facility] END[facility]COPY;  Service: Endoscopy;  Laterality: N/A;   COLONOSCOPY WITH PROPOFOL  N/A 05/13/2022   Procedure: COLONOSCOPY WITH PROPOFOL ;  Surgeon: Maryruth Ole ONEIDA, MD;  Location: [facility] ENDOSCOPY;  Service: Endoscopy;  Laterality: N/A;   ESOPHAGOGASTRODUODENOSCOPY (EGD) WITH PROPOFOL  N/A 04/07/2015   Procedure: ESOPHAGOGASTRODUODENOSCOPY (EGD) WITH PROPOFOL ;  Surgeon: Lamar ONEIDA Holmes, MD;  Location: [facility] ENDOSCOPY;  Service: Endoscopy;  Laterality: N/A;   ESOPHAGOGASTRODUODENOSCOPY (EGD) WITH PROPOFOL  N/A 05/13/2022   Procedure: ESOPHAGOGASTRODUODENOSCOPY (EGD) WITH PROPOFOL ;  Surgeon: Maryruth Ole  T, MD;  Location: [facility] ENDOSCOPY;  Service: Endoscopy;  Laterality: N/A;   JOINT REPLACEMENT     LUMBAR LAMINECTOMY/DECOMPRESSION MICRODISCECTOMY N/A 06/26/2016   Procedure: LUMBAR LAMINECTOMY/DECOMPRESSION  MICRODISCECTOMY 2 LEVELS L5-S1;  Surgeon: Reeves Daisy, MD;  Location: [facility] ORS;  Service: Neurosurgery;  Laterality: N/A;   TONSILLECTOMY      Allergies: Allergies as of 04/07/2024 - Review Complete 12/09/2023  Allergen Reaction Noted   Levaquin [levofloxacin] Other (See Comments) and Anxiety 04/06/2015    Medications: Outpatient Encounter Medications as of 04/07/2024  Medication Sig   allopurinol  (ZYLOPRIM ) 300 MG tablet Take 300 mg by mouth daily.   ALPRAZolam  (XANAX ) 0.25 MG tablet Take by mouth as needed.   atorvastatin (LIPITOR) 20 MG tablet Take 20 mg by mouth.   cholecalciferol (VITAMIN D3) 25 MCG (1000 UNIT) tablet Take 1,000 Units by mouth daily.   cyanocobalamin (VITAMIN B12) 1000 MCG tablet Take by mouth.   fluorouracil  (EFUDEX ) 5 % cream Apply topically 2 (two) times daily. Apply to the arms, hands, and scalp BID x 10 days.   gabapentin  (NEURONTIN ) 100 MG capsule Take 200 mg by mouth at bedtime.   latanoprost (XALATAN) 0.005 % ophthalmic solution SMARTSIG:In Eye(s)   magnesium oxide (MAG-OX) 400 (240 Mg) MG tablet Take 400 mg by mouth daily.   Multiple Vitamins-Minerals (ICAPS AREDS 2 PO) Take 2 capsules by mouth daily.   No facility-administered encounter medications on file as of 04/07/2024.    Social History: Social History   Tobacco Use   Smoking status: Former    Current packs/day: 0.00    Average packs/day: 1.5 packs/day for 5.8 years (8.7 ttl pk-yrs)    Types: Cigarettes    Start date: 1964    Quit date: 04/10/1968    Years since quitting: 56.0   Smokeless tobacco: Never  Vaping Use   Vaping status: Never Used  Substance Use Topics   Alcohol use: Yes    Alcohol/week: 3.0 standard drinks of alcohol    Types: 3 Cans of beer per week    Comment: social   Drug use: No    Family Medical History: Family History  Problem Relation Age of Onset   Pancreatic cancer Mother 53   Stomach cancer Maternal Aunt        d. 17   Cancer Maternal Uncle         x2 two mat uncles (identical twins), dx 49s   Lung cancer Paternal Uncle        d. 19s; smoking hx   Ovarian cancer Maternal Grandmother        dx 50s   Cancer Cousin        unknown type; maternal male cousin; dx after 73   Breast cancer Cousin        pat male cousin; dx before 73    Physical Examination: @VITALWITHPAIN @  General: Patient is well developed, well nourished, calm, collected, and in no apparent distress. Attention to examination is appropriate.  Psychiatric: Patient is non-anxious.  Head:  Pupils equal, round, and reactive to light.  ENT:  Oral mucosa appears well hydrated.  Neck:   Supple.  Full range of motion.  Respiratory: Patient is breathing without any difficulty.  Extremities: No edema.  Vascular: Palpable dorsal pedal pulses.  Skin:   On exposed skin, there are no abnormal skin lesions.  NEUROLOGICAL:     Awake, alert, oriented to person, place, and time.  Speech is clear and fluent. Fund of knowledge is appropriate.  Cranial Nerves: Pupils equal round and reactive to light.  Facial tone is symmetric.   ROM of spine: Patient has some tenderness palpation of his sacrum  Strength:  Side Iliopsoas Quads Hamstring PF DF EHL  R 5 5 5 5 5 5   L 5 5 5 5 5 5    Reflexes are 2+ and symmetric at the  patella and achilles.    Clonus is not present.  Toes are down-going.  Decreased sensation in bilateral feet Gait is normal.   No difficulty with tandem gait.   No evidence of dysmetria noted.  Medical Decision Making  Imaging: EXAM: MRI LUMBAR SPINE WITHOUT CONTRAST   TECHNIQUE: Multiplanar, multisequence MR imaging of the lumbar spine was performed. No intravenous contrast was administered.   COMPARISON:  Lumbar MRI 10/31/2017   FINDINGS: Segmentation:  Normal   Alignment: Mild retrolisthesis L3-4. 4 mm anterolisthesis L4-5 unchanged.   Vertebrae: Large hemangioma L3 vertebral body unchanged from prior studies. Negative for  fracture or mass.   Conus medullaris and cauda equina: Conus extends to the T12-L1 level. Conus and cauda equina appear normal.   Paraspinal and other soft tissues: Negative for paraspinous mass or adenopathy.   Disc levels:   L1-2: Disc degeneration with mild diffuse disc bulging and mild facet degeneration. No significant stenosis.   L2-3: Mild disc bulging and mild facet hypertrophy. No significant stenosis   L3-4: Disc degeneration with diffuse disc bulging and endplate spurring. Mild facet degeneration. Spinal canal normal in size. Mild subarticular stenosis on the right, unchanged from the prior MRI   L4-5: Left laminectomy. Grade 1 anterolisthesis. Diffuse bulging of the disc. Right subarticular and foraminal disc protrusion with mild progression. Severe facet degeneration with bilateral facet hypertrophy unchanged. Moderate to severe spinal stenosis unchanged. Severe subarticular stenosis on the right as well as moderate right foraminal stenosis. Right L4 and L5 nerve root impingement. Moderate subarticular and foraminal stenosis on the left.   L5-S1: Left laminectomy. Moderate facet degeneration. Diffuse disc bulging. Moderate subarticular and foraminal stenosis bilaterally, unchanged from the prior study.   IMPRESSION: 1. 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 Progressive right-sided disc protrusion since the prior MRI. Moderate to severe spinal stenosis L4-5 unchanged. Moderate left subarticular and foraminal stenosis L4-5 2. Left laminectomy L5-S1. Moderate subarticular and foraminal stenosis bilaterally, unchanged.    I have personally reviewed the images and agree with the above interpretation.  Assessment and Plan: Joe Tyler is a pleasant 83 y.o. male with previous lumbar decompression who comes in today for worsening SI joint pain over the last 2 months.  This is nonradiating.  Patient states that he previously talked to Dr. Katrina regarding his  pain and they briefly discussed a potential fusion.  He has had multiple previous injections before seen in the beginning of the note.  Would like a 4 view x-ray of patient's lumbar spine today.  In addition we will go ahead and get an updated MRI of his lumbar spine due to worsening pain.  I discussed physical therapy with the patient, but he states that he would like to see Dr. Katrina before moving forward with this again as this is a longstanding issue that has become worse and has not been previously relieved by physical therapy.   Lyle Decamp, PA-C Dept. of Neurosurgery

## 2024-04-07 ENCOUNTER — Ambulatory Visit: Admitting: Physician Assistant

## 2024-04-07 ENCOUNTER — Encounter: Payer: Self-pay | Admitting: Physician Assistant

## 2024-04-07 ENCOUNTER — Ambulatory Visit (INDEPENDENT_AMBULATORY_CARE_PROVIDER_SITE_OTHER)

## 2024-04-07 VITALS — BP 120/68 | Wt 203.8 lb

## 2024-04-07 DIAGNOSIS — M545 Low back pain, unspecified: Secondary | ICD-10-CM

## 2024-04-07 DIAGNOSIS — M533 Sacrococcygeal disorders, not elsewhere classified: Secondary | ICD-10-CM | POA: Diagnosis not present

## 2024-04-21 ENCOUNTER — Ambulatory Visit
Admission: RE | Admit: 2024-04-21 | Discharge: 2024-04-21 | Disposition: A | Discharge status: Home / Self Care | Source: Ambulatory Visit | Attending: Physician Assistant | Admitting: Physician Assistant

## 2024-04-21 DIAGNOSIS — M545 Low back pain, unspecified: Secondary | ICD-10-CM | POA: Insufficient documentation

## 2024-04-26 ENCOUNTER — Ambulatory Visit: Payer: Self-pay | Admitting: Physician Assistant

## 2024-05-04 ENCOUNTER — Encounter: Payer: Self-pay | Admitting: Physician Assistant

## 2024-05-04 ENCOUNTER — Ambulatory Visit: Admitting: Physician Assistant

## 2024-05-04 VITALS — BP 140/82 | Ht 70.0 in | Wt 203.0 lb

## 2024-05-04 DIAGNOSIS — M48062 Spinal stenosis, lumbar region with neurogenic claudication: Secondary | ICD-10-CM

## 2024-05-04 DIAGNOSIS — M48061 Spinal stenosis, lumbar region without neurogenic claudication: Secondary | ICD-10-CM | POA: Diagnosis not present

## 2024-05-04 DIAGNOSIS — M4316 Spondylolisthesis, lumbar region: Secondary | ICD-10-CM | POA: Diagnosis not present

## 2024-05-04 NOTE — Progress Notes (Unsigned)
 Referring Physician:  Rudolpho Norleen BIRCH, MD [address] [facility] [place],  [place] [address]  Primary Physician:  Rudolpho Norleen BIRCH, MD  History of Present Illness: 05/04/2024 Joe Tyler is here today with a chief complaint of acute on chronic back pain that has become worse over the past 2 months.  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  He previously underwent a lumbar decompression and microdiscectomy in 2018.  Pain is primarily in his tailbone and 1 side is not worse than the other.  He denies any radiation down his legs.  He has done physical therapy in the past.  Numbness in both feet and effects balance of how he is feelig on bottom of feet.  Still canw alk a distance. Not a ton of heaviness, some muscle cramping but gabapenitn helped that.  Hip weakness.  No foot weakness or other leg weakness.  Hasd some retention    Is some stumbling    Weakness: none  Bowel/Bladder Dysfunction: none  Conservative measures:  Physical therapy: Has not participated in since 2024. Multimodal medical therapy including regular antiinflammatories: Gabapentin , Robaxin  Injections: None  -s/p right L5-S1 and S1 TFESI 11/20/2023 with no significant relief  -s/p right S1 TFESI 10/13/2023 with 50% relief -s/p bilateral L3, L4 medial branch and dorsal rami RFA 02/20/2022 with 80% relief -s/p bilateral S1 TFESI 11/29/2021 with 1 day of relief  -s/p bilateral SI joint injections 02/14/2023 with 90% improvement -s/p bilateral SI joint injections 06/02/2023 with 50-70% improvement -s/p bilateral SI joint injections 09/11/2023 with 70% improvement  -s/p left C4-5 TFESI 06/29/2021 with minimal relief -s/p right C5-6 TFESI 07/23/2021 with minimal relief   Past Surgery: 06/26/2016-LUMBAR LAMINECTOMY/DECOMPRESSION MICRODISCECTOMY   Joe Tyler has no symptoms of cervical myelopathy.  The symptoms are causing a significant impact on the patient's life.   Review of Systems:  A  10 point review of systems is negative, except for the pertinent positives and negatives detailed in the HPI.  Past Medical History: Past Medical History:  Diagnosis Date   Actinic keratosis    Hx PDT   Anxiety    Panic Attack   Arthritis    Diabetes mellitus without complication (HCC)    no meds   Family history of breast cancer 04/04/2022   Family history of pancreatic cancer 04/04/2022   Gout    History of colonic polyps 04/04/2022   Hyperlipidemia    Motion sickness    ocean boats   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HCC)    PVD (peripheral vascular disease)    Shingles    Spinal stenosis    Wears hearing aid in both ears     Past Surgical History: Past Surgical History:  Procedure Laterality Date   benign tumor removed Right arm   BILATERAL HIP ARTHROSCOPY     CATARACT EXTRACTION W/PHACO Left 10/15/2023   Procedure: PHACOEMULSIFICATION, CATARACT, WITH IOL INSERTION 7.54 00:34.1;  Surgeon: Mittie Gaskin, MD;  Location: [facility];  Service: Ophthalmology;  Laterality: Left;   CATARACT EXTRACTION W/PHACO Right 10/29/2023   Procedure: PHACOEMULSIFICATION, CATARACT, WITH IOL INSERTION 6.00 00:29.5;  Surgeon: Mittie Gaskin, MD;  Location: [facility];  Service: Ophthalmology;  Laterality: Right;   COLONOSCOPY WITH PROPOFOL  N/A 04/07/2015   Procedure: COLONOSCOPY WITH PROPOFOL ;  Surgeon: Lamar ONEIDA Holmes, MD;  Location: [facility] ENDOSCOPY;  Service: Endoscopy;  Laterality: N/A;   COLONOSCOPY WITH PROPOFOL  N/A 05/13/2022   Procedure: COLONOSCOPY WITH PROPOFOL ;  Surgeon: Maryruth Ole ONEIDA, MD;  Location: [facility]  ENDOSCOPY;  Service: Endoscopy;  Laterality: N/A;   ESOPHAGOGASTRODUODENOSCOPY (EGD) WITH PROPOFOL  N/A 04/07/2015   Procedure: ESOPHAGOGASTRODUODENOSCOPY (EGD) WITH PROPOFOL ;  Surgeon: Lamar ONEIDA Holmes, MD;  Location: [facility] ENDOSCOPY;  Service: Endoscopy;  Laterality: N/A;   ESOPHAGOGASTRODUODENOSCOPY (EGD) WITH PROPOFOL  N/A 05/13/2022   Procedure:  ESOPHAGOGASTRODUODENOSCOPY (EGD) WITH PROPOFOL ;  Surgeon: Maryruth Ole ONEIDA, MD;  Location: [facility] ENDOSCOPY;  Service: Endoscopy;  Laterality: N/A;   JOINT REPLACEMENT     LUMBAR LAMINECTOMY/DECOMPRESSION MICRODISCECTOMY N/A 06/26/2016   Procedure: LUMBAR LAMINECTOMY/DECOMPRESSION MICRODISCECTOMY 2 LEVELS L5-S1;  Surgeon: Reeves Daisy, MD;  Location: [facility] ORS;  Service: Neurosurgery;  Laterality: N/A;   TONSILLECTOMY      Allergies: Allergies as of 05/04/2024 - Review Complete 05/04/2024  Allergen Reaction Noted   Levaquin [levofloxacin] Other (See Comments) and Anxiety 04/06/2015    Medications: Outpatient Encounter Medications as of 05/04/2024  Medication Sig   allopurinol  (ZYLOPRIM ) 300 MG tablet Take 300 mg by mouth daily.   ALPRAZolam  (XANAX ) 0.25 MG tablet Take by mouth as needed.   atorvastatin (LIPITOR) 20 MG tablet Take 20 mg by mouth.   cholecalciferol (VITAMIN D3) 25 MCG (1000 UNIT) tablet Take 1,000 Units by mouth daily.   cyanocobalamin (VITAMIN B12) 1000 MCG tablet Take by mouth.   fluorouracil  (EFUDEX ) 5 % cream Apply topically 2 (two) times daily. Apply to the arms, hands, and scalp BID x 10 days.   gabapentin  (NEURONTIN ) 300 MG capsule Take 600 mg by mouth at bedtime.   latanoprost (XALATAN) 0.005 % ophthalmic solution SMARTSIG:In Eye(s)   magnesium oxide (MAG-OX) 400 (240 Mg) MG tablet Take 400 mg by mouth daily.   Multiple Vitamins-Minerals (ICAPS AREDS 2 PO) Take 2 capsules by mouth daily.   [DISCONTINUED] gabapentin  (NEURONTIN ) 100 MG capsule Take 200 mg by mouth at bedtime.   No facility-administered encounter medications on file as of 05/04/2024.    Social History: Social History   Tobacco Use   Smoking status: Former    Current packs/day: 0.00    Average packs/day: 1.5 packs/day for 5.8 years (8.7 ttl pk-yrs)    Types: Cigarettes    Start date: 1964    Quit date: 04/10/1968    Years since quitting: 56.1   Smokeless tobacco: Never  Vaping Use    Vaping status: Never Used  Substance Use Topics   Alcohol use: Yes    Alcohol/week: 3.0 standard drinks of alcohol    Types: 3 Cans of beer per week    Comment: social   Drug use: No    Family Medical History: Family History  Problem Relation Age of Onset   Pancreatic cancer Mother 64   Stomach cancer Maternal Aunt        d. 68   Cancer Maternal Uncle        x2 two mat uncles (identical twins), dx 58s   Lung cancer Paternal Uncle        d. 20s; smoking hx   Ovarian cancer Maternal Grandmother        dx 34s   Cancer Cousin        unknown type; maternal male cousin; dx after 44   Breast cancer Cousin        pat male cousin; dx before 37    Physical Examination: @VITALWITHPAIN @  General: Patient is well developed, well nourished, calm, collected, and in no apparent distress. Attention to examination is appropriate.  Psychiatric: Patient is non-anxious.  Head:  Pupils equal, round, and reactive to light.  ENT:  Oral  mucosa appears well hydrated.  Neck:   Supple.  Full range of motion.  Respiratory: Patient is breathing without any difficulty.  Extremities: No edema.  Vascular: Palpable dorsal pedal pulses.  Skin:   On exposed skin, there are no abnormal skin lesions.  NEUROLOGICAL:     Awake, alert, oriented to person, place, and time.  Speech is clear and fluent. Fund of knowledge is appropriate.   Cranial Nerves: Pupils equal round and reactive to light.  Facial tone is symmetric.   ROM of spine: Patient has some tenderness palpation of his sacrum  Strength:   Diffusely hyporeflexic   Side Iliopsoas Quads Hamstring PF DF EHL  R 5 5 5 5 5 5   L 5 5 5 5 5 5   Not a tandem gait... right foot kind of goes out, hard to describe Reflexes are 2+ and symmetric at the  patella and achilles.    Clonus is not present.  Toes are down-going.  Decreased sensation in bilateral feet Gait is normal.   No difficulty with tandem gait.   No evidence of dysmetria  noted.  Medical Decision Making  Imaging: EXAM: MRI LUMBAR SPINE 04/21/2024 09:11:33 AM   TECHNIQUE: Multiplanar multisequence MRI of the lumbar spine was performed without the administration of intravenous contrast.   COMPARISON: MR Lumbar Spine without Contrast 06/19/2020; MR Lumbar Spine without Contrast 10/31/2017.   CLINICAL HISTORY: Low back pain unspecified - M54.50, prior surgery.   FINDINGS:   BONES AND ALIGNMENT: Similar grade 1 anterolisthesis of L4 on L5. Alignment otherwise maintained. Normal vertebral body heights. Bone marrow signal is unremarkable. Modic type 1 degenerative endplate changes at L5-S1 with associated discogenic edema particularly along the left anterior aspect of the S1 superior endplate. Additional subtle discogenic edema at L2-L3. Signal abnormality throughout the L1 vertebral body which may reflect a hemangioma. No evidence of fracture.   SPINAL CORD: The conus medullaris extends to the L1 level.   SOFT TISSUES: No paraspinal mass.   T12-L1: No significant spinal canal or foraminal stenosis. Mild bilateral facet arthrosis.   L1-L2: Disc desiccation and mild disc height loss. Minimal disc bulge. Mild to moderate facet arthrosis. There is no significant spinal canal stenosis. No significant foraminal stenosis.   L2-L3: Disc desiccation and mild disc height loss. Small disc bulge. Mild to moderate facet arthrosis and thickening of the ligamentum flavum. There is lateral recess narrowing greater on the right which is increased from prior with possible impingement of the traversing right L3 nerve root. No significant spinal canal stenosis. No significant foraminal stenosis.   L3-L4: Disc desiccation. Small disc bulge. Moderate facet arthrosis and thickening of the ligamentum flavum. There is slightly increased lateral recess narrowing. No significant spinal canal stenosis. Mild bilateral foraminal stenosis.   L4-L5: Postsurgical  changes of left laminectomy. Similar anterolisthesis. Diffuse disc bulge indents the ventral thecal sac resulting in significant lateral recess narrowing. Severe bilateral facet arthrosis with bilateral facet effusions. Additionally, there is thickening of the ligamentum flavum. There are bilateral cystic foci along the dorsal aspect of the spinal canal which may reflect synovial cysts versus ligamentum flavum cysts, the largest on the right measuring up to 8 mm and on the left measuring up to 5 mm. There is associated moderate to severe spinal canal stenosis at the level of the L4-L5 disc which has increased from prior. Additional severe spinal canal stenosis just below the level of the L4-L5 disc secondary to facet arthrosis and cystic foci. There is moderate right  and mild left foraminal stenosis. Foraminal stenosis on the right is slightly increased.   L5-S1: Disc desiccation. Right paracentral disc protrusion and associated annular fissure. Moderate facet arthrosis and thickening of the ligamentum flavum. There is lateral recess narrowing which is increased from prior with possible impingement of the traversing S1 nerve roots. Mild spinal canal stenosis. There is moderate bilateral foraminal stenosis which has increased from prior. Small cystic foci adjacent to the bilateral L5-S1 facets suggestive of synovial cysts, the largest on the right measuring up to 5 mm.     IMPRESSION: 1. 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 Moderate right and mild left foraminal stenosis, with right foraminal stenosis slightly increased. 2. Mild spinal canal stenosis at L5-S1 with lateral recess narrowing, increased from prior, and possible impingement of the traversing S1 nerve roots. Moderate bilateral foraminal  stenosis, increased from prior. 3. Lateral recess narrowing at L2-L3, greater on the right and increased from prior, with possible impingement of the traversing right L3 nerve root. 4. Additional degenerative changes as above.    I have personally reviewed the images and agree with the above interpretation.  Assessment and Plan: Joe Tyler is a pleasant 83 y.o. male with previous lumbar decompression who comes in today for worsening SI joint pain over the last 2 months.  This is nonradiating.  Patient states that he previously talked to Dr. Katrina regarding his pain and they briefly discussed a potential fusion.  He has had multiple previous injections before seen in the beginning of the note.  Would like a 4 view x-ray of patient's lumbar spine today.  In addition we will go ahead and get an updated MRI of his lumbar spine due to worsening pain.  I discussed physical therapy with the patient, but he states that he would like to see Dr. Katrina before moving forward with this again as this is a longstanding issue that has become worse and has not been previously relieved by physical therapy.   Lyle Decamp, PA-C Dept. of Neurosurgery

## 2024-05-17 NOTE — Progress Notes (Unsigned)
 Referring Physician:  Rudolpho Norleen BIRCH, MD [address] [facility] [place],  [place] [address]  Primary Physician:  Rudolpho Norleen BIRCH, MD  History of Present Illness: 05/20/2024 [name] is here today with a chief complaint of back and leg pain.  He has been having worsening symptoms as noted below.  He is being evaluated by PT next week.  The symptoms are causing a significant impact on the patient's life.   I have utilized the care everywhere function in epic to review the outside records available from external health systems.  Progress Note from [place], [place] on 05/04/24:  History of Present Illness: 05/04/2024 [name] is here today for follow-up on acute on chronic back pain that has become worse over the past 3 months.  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  He previously underwent a lumbar decompression and microdiscectomy in 2018.  Pain continues to be primarily in his low back and buttock and does not radiate down his legs.  He does feel as though his chronic neuropathy in bilateral feet affects his balance and walking.  Denies much heaviness in his feet, but has had some muscle cramping.  He has not participated in formal physical therapy within the past year.    Weakness: none   Bowel/Bladder Dysfunction: none   Conservative measures:  Physical therapy: Has not participated in since 2024. Multimodal medical therapy including regular antiinflammatories: Gabapentin , Robaxin  Injections: None  -s/p right L5-S1 and S1 TFESI 11/20/2023 with no significant relief  -s/p right S1 TFESI 10/13/2023 with 50% relief -s/p bilateral L3, L4 medial branch and dorsal rami RFA 02/20/2022 with 80% relief -s/p bilateral S1 TFESI 11/29/2021 with 1 day of relief   -s/p bilateral SI joint injections 02/14/2023 with 90% improvement -s/p bilateral SI joint injections 06/02/2023 with 50-70% improvement -s/p bilateral SI joint injections 09/11/2023 with  70% improvement  -s/p left C4-5 TFESI 06/29/2021 with minimal relief -s/p right C5-6 TFESI 07/23/2021 with minimal relief    Past Surgery:  06/26/2016-LUMBAR LAMINECTOMY/DECOMPRESSION MICRODISCECTOMY   Review of Systems:  A 10 point review of systems is negative, except for the pertinent positives and negatives detailed in the HPI.  Past Medical History: Past Medical History:  Diagnosis Date   Actinic keratosis    Hx PDT   Anxiety    Panic Attack   Arthritis    Diabetes mellitus without complication (HCC)    no meds   Family history of breast cancer 04/04/2022   Family history of pancreatic cancer 04/04/2022   Gout    History of colonic polyps 04/04/2022   Hyperlipidemia    Motion sickness    ocean boats   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HCC)    PVD (peripheral vascular disease)    Shingles    Spinal stenosis    Wears hearing aid in both ears     Past Surgical History: Past Surgical History:  Procedure Laterality Date   benign tumor removed Right arm   BILATERAL HIP ARTHROSCOPY     CATARACT EXTRACTION W/PHACO Left 10/15/2023   Procedure: PHACOEMULSIFICATION, CATARACT, WITH IOL INSERTION 7.54 00:34.1;  Surgeon: Mittie Gaskin, MD;  Location: [facility];  Service: Ophthalmology;  Laterality: Left;   CATARACT EXTRACTION W/PHACO Right 10/29/2023   Procedure: PHACOEMULSIFICATION, CATARACT, WITH IOL INSERTION 6.00 00:29.5;  Surgeon: Mittie Gaskin, MD;  Location: [facility];  Service: Ophthalmology;  Laterality: Right;   COLONOSCOPY WITH PROPOFOL  N/A 04/07/2015   Procedure: COLONOSCOPY WITH PROPOFOL ;  Surgeon: Lamar ONEIDA Holmes, MD;  Location: [facility] ENDOSCOPY;  Service: Endoscopy;  Laterality: N/A;   COLONOSCOPY WITH PROPOFOL  N/A 05/13/2022   Procedure: COLONOSCOPY WITH PROPOFOL ;  Surgeon: Maryruth Ole ONEIDA, MD;  Location: [facility] ENDOSCOPY;  Service: Endoscopy;  Laterality: N/A;   ESOPHAGOGASTRODUODENOSCOPY (EGD) WITH PROPOFOL  N/A 04/07/2015   Procedure:  ESOPHAGOGASTRODUODENOSCOPY (EGD) WITH PROPOFOL ;  Surgeon: Lamar ONEIDA Holmes, MD;  Location: [facility] ENDOSCOPY;  Service: Endoscopy;  Laterality: N/A;   ESOPHAGOGASTRODUODENOSCOPY (EGD) WITH PROPOFOL  N/A 05/13/2022   Procedure: ESOPHAGOGASTRODUODENOSCOPY (EGD) WITH PROPOFOL ;  Surgeon: Maryruth Ole ONEIDA, MD;  Location: [facility] ENDOSCOPY;  Service: Endoscopy;  Laterality: N/A;   JOINT REPLACEMENT     LUMBAR LAMINECTOMY/DECOMPRESSION MICRODISCECTOMY N/A 06/26/2016   Procedure: LUMBAR LAMINECTOMY/DECOMPRESSION MICRODISCECTOMY 2 LEVELS L5-S1;  Surgeon: Reeves Daisy, MD;  Location: [facility] ORS;  Service: Neurosurgery;  Laterality: N/A;   TONSILLECTOMY      Allergies: Allergies as of 05/20/2024 - Review Complete 05/20/2024  Allergen Reaction Noted   Levaquin [levofloxacin] Other (See Comments) and Anxiety 04/06/2015    Medications:  Current Outpatient Medications:    allopurinol  (ZYLOPRIM ) 300 MG tablet, Take 300 mg by mouth daily., Disp: , Rfl:    ALPRAZolam  (XANAX ) 0.25 MG tablet, Take by mouth as needed., Disp: , Rfl:    atorvastatin (LIPITOR) 20 MG tablet, Take 20 mg by mouth., Disp: , Rfl:    cholecalciferol (VITAMIN D3) 25 MCG (1000 UNIT) tablet, Take 1,000 Units by mouth daily., Disp: , Rfl:    cyanocobalamin (VITAMIN B12) 1000 MCG tablet, Take by mouth., Disp: , Rfl:    fluorouracil  (EFUDEX ) 5 % cream, Apply topically 2 (two) times daily. Apply to the arms, hands, and scalp BID x 10 days., Disp: 30 g, Rfl: 1   gabapentin  (NEURONTIN ) 300 MG capsule, Take 600 mg by mouth at bedtime., Disp: , Rfl:    latanoprost (XALATAN) 0.005 % ophthalmic solution, SMARTSIG:In Eye(s), Disp: , Rfl:    magnesium oxide (MAG-OX) 400 (240 Mg) MG tablet, Take 400 mg by mouth daily., Disp: , Rfl:    Multiple Vitamins-Minerals (ICAPS AREDS 2 PO), Take 2 capsules by mouth daily., Disp: , Rfl:   Social History: Social History   Tobacco Use   Smoking status: Former    Current packs/day: 0.00    Average packs/day:  1.5 packs/day for 5.8 years (8.7 ttl pk-yrs)    Types: Cigarettes    Start date: 1964    Quit date: 04/10/1968    Years since quitting: 56.1   Smokeless tobacco: Never  Vaping Use   Vaping status: Never Used  Substance Use Topics   Alcohol use: Yes    Alcohol/week: 3.0 standard drinks of alcohol    Types: 3 Cans of beer per week    Comment: social   Drug use: No    Family Medical History: Family History  Problem Relation Age of Onset   Pancreatic cancer Mother 56   Stomach cancer Maternal Aunt        d. 43   Cancer Maternal Uncle        x2 two mat uncles (identical twins), dx 31s   Lung cancer Paternal Uncle        d. 66s; smoking hx   Ovarian cancer Maternal Grandmother        dx 22s   Cancer Cousin        unknown type; maternal male cousin; dx after 35   Breast cancer Cousin        pat male cousin; dx before  50    Physical Examination: Vitals:   05/20/24 0956  BP: 136/84    General: Patient is in no apparent distress. Attention to examination is appropriate.  Neck:   Supple.  Full range of motion.  Respiratory: Patient is breathing without any difficulty.   NEUROLOGICAL:     Awake, alert, oriented to person, place, and time.  Speech is clear and fluent.   Cranial Nerves: Pupils equal round and reactive to light.  Facial tone is symmetric.  Facial sensation is symmetric. Shoulder shrug is symmetric. Tongue protrusion is midline.  There is no pronator drift.  Strength: Side Biceps Triceps Deltoid Interossei Grip Wrist Ext. Wrist Flex.  R 5 5 5 5 5 5 5   L 5 5 5 5 5 5 5    Side Iliopsoas Quads Hamstring PF DF EHL  R 5 5 5 5 5 5   L 5 5 5 5 5 5    Reflexes are 1+ and symmetric at the biceps, triceps, brachioradialis, patella and achilles.   Hoffman's is absent.   Bilateral upper and lower extremity sensation is intact to light touch.    No evidence of dysmetria noted.  Gait is antalgic     Medical Decision Making  Imaging: MRI L spine  04/21/2024 IMPRESSION: 1. 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 Moderate right and mild left foraminal stenosis, with right foraminal stenosis slightly increased. 2. Mild spinal canal stenosis at L5-S1 with lateral recess narrowing, increased from prior, and possible impingement of the traversing S1 nerve roots. Moderate bilateral foraminal stenosis, increased from prior. 3. Lateral recess narrowing at L2-L3, greater on the right and increased from prior, with possible impingement of the traversing right L3 nerve root. 4. Additional degenerative changes as above.   Electronically signed by: Donnice Mania MD 04/24/2024 07:08 PM EST RP Workstation: [id number]  L spine Flex Ext 04/07/2024 IMPRESSION: 1. Mild scoliosis with moderate to advanced lower lumbar facet degenerative changes. 2. Grade 1 anterolisthesis L4 on L5 that augments with flexion.     Electronically Signed   By: Luke Bun M.D.   On: 04/09/2024 23:51   On my measurements this anterolisthesis changes from 8 to 14 mm.  I have personally reviewed the images and agree with the above interpretation.  Assessment and Plan: [name] is a pleasant 83 y.o. male with instability at L4/5 causing back pain and sciatica.  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  He will be evaluated by PT next week, though I feel his spondylolisthesis warrants intervention due to worsening imaging and clinical findings.I recommended L4/5 XLIF/PSF with decompression.  I discussed the planned procedure at length with the patient, including the risks, benefits, alternatives, and indications. The risks discussed include but are not limited to bleeding, infection, need for reoperation, spinal fluid leak, stroke, vision loss, anesthetic complication, coma, paralysis, and  even death. I also described the possibility of psoas weakness and paresthesias. I described in detail that improvement was not guaranteed.  The patient expressed understanding of these risks, and asked that we proceed with surgery. I described the surgery in layman's terms, and gave ample opportunity for questions, which were answered to the best of my ability.   I spent a total of 30 minutes in this patient's care today. This time was spent reviewing pertinent records including imaging studies, obtaining and confirming history, performing  a directed evaluation, formulating and discussing my recommendations, and documenting the visit within the medical record.      Thank you for involving me in the care of this patient.      Lucerito Rosinski K. Clois MD, [facility] Neurosurgery

## 2024-05-20 ENCOUNTER — Encounter: Payer: Self-pay | Admitting: Neurosurgery

## 2024-05-20 ENCOUNTER — Ambulatory Visit: Admitting: Neurosurgery

## 2024-05-20 ENCOUNTER — Other Ambulatory Visit: Payer: Self-pay

## 2024-05-20 VITALS — BP 136/84 | Ht 70.0 in | Wt 203.0 lb

## 2024-05-20 DIAGNOSIS — M4316 Spondylolisthesis, lumbar region: Secondary | ICD-10-CM | POA: Diagnosis not present

## 2024-05-20 DIAGNOSIS — M532X6 Spinal instabilities, lumbar region: Secondary | ICD-10-CM | POA: Diagnosis not present

## 2024-05-20 DIAGNOSIS — M5442 Lumbago with sciatica, left side: Secondary | ICD-10-CM

## 2024-05-20 DIAGNOSIS — Z01818 Encounter for other preprocedural examination: Secondary | ICD-10-CM

## 2024-05-20 DIAGNOSIS — G8929 Other chronic pain: Secondary | ICD-10-CM

## 2024-05-20 DIAGNOSIS — M5441 Lumbago with sciatica, right side: Secondary | ICD-10-CM | POA: Diagnosis not present

## 2024-05-20 NOTE — Patient Instructions (Signed)
 Please see below for information in regards to your upcoming surgery:   Planned surgery: L4/5 lateral lumbar interbody fusion and posterior spinal fusion with decompression   Surgery date: 06/28/24 at [facility] (Medical Mall: [address], [place], [place] [address]) - you will find out your arrival time the business day before your surgery.   Pre-op appointment at [facility] Pre-admit Testing: you will receive a call with a date/time for this appointment. If you are scheduled for an in person appointment, Pre-admit Testing is located on the first floor of the [facility] Arts building, [address], [address]. During this appointment, they will advise you which medications you can take the morning of surgery, and which medications you will need to hold for surgery. Labs (such as blood work, EKG) may be done at your pre-op appointment. You are not required to fast for these labs. Should you need to change your pre-op appointment, please call Pre-admit testing at [phone].     Surgical clearance: we will send a clearance form to Dr Rudolpho. They may wish to see you in their office prior to signing the clearance form. If so, they may call you to schedule an appointment.     NSAIDS (Non-steroidal anti-inflammatory drugs): because you are having a fusion, please avoid taking any NSAIDS (examples: ibuprofen, motrin, aleve, naproxen, meloxicam, diclofenac) for 3 months after surgery. Celebrex is an exception and is OK to take, if prescribed. Tylenol  is not an NSAID.    Common restrictions after spine surgery: No bending, lifting, or twisting ("BLT"). Avoid lifting objects heavier than 10 pounds for the first 6 weeks after surgery. Where possible, avoid household activities that involve lifting, bending, reaching, pushing, or pulling such as laundry, vacuuming, grocery shopping, and childcare. Try to arrange for help from friends and family for these  activities while you heal. Do not drive while taking prescription pain medication. Weeks 6 through 12 after surgery: avoid lifting more than 25 pounds.    X-rays after surgery: Because you are having a fusion or arthroplasty: for appointments after your 2 week follow-up: please arrive our office 30 minutes prior to your appointment for x-rays. This applies to every appointment after your 2 week follow-up. Failure to do so may result in your appointment being rescheduled.    How to contact us :  If you have any questions/concerns before or after surgery, you can reach us  at [phone], or you can send a mychart message. We can be reached by phone or mychart 8am-4pm, Monday-Friday.  *Please note: Calls after 4pm are forwarded to a third party answering service. Mychart messages are not routinely monitored during evenings, weekends, and holidays. Please call our office to contact the answering service for urgent concerns during non-business hours.   If you have FMLA/disability paperwork, please drop it off or fax it to [phone]   Appointments/FMLA & disability paperwork: Reche Hait, & Nichole Registered Nurse/Surgery scheduler: Liat Mayol, RN & Katie, RN Certified Medical Assistants: Don, CMA, Elenor, CMA, Damien, CMA, & Auston, [place] Physician Assistants: Lyle Decamp, PA-C, Edsel Goods, PA-C & Glade Boys, PA-C Surgeons: Penne Sharps, MD & Reeves Daisy, MD   [facility] PREADMIT TESTING VISIT and SURGERY INFORMATION SHEET   Now that surgery has been scheduled you can anticipate several phone calls from [facility] services. A pharmacy technician will call you to verify your current list of medications taken at home.  The Pre-Service Center will call to verify your insurance information and to give you billing estimates and information.             The Preadmit Testing Office will be calling to schedule a visit to obtain information for  the anesthesia team and provide instructions on preparation for surgery.  What can you expect for the Preadmit Testing Visit: Appointments may be scheduled in-person or by telephone.  If a telephone visit is scheduled, you may be asked to come into the office to have lab tests or other studies performed.   This visit will not be completed any greater than 14 days prior to your surgery.  If your surgery has been scheduled for a future date, please do not be alarmed if we have not contacted you to schedule an appointment more than a month prior to the surgery date.    Please be prepared to provide the following information during this appointment:            -Personal medical history                                               -Medication and allergy list            -Any history of problems with anesthesia              -Recent lab work or diagnostic studies            -Please notify us  of any needs we should be aware of to provide the best care possible           -You will be provided with instructions on how to prepare for your surgery.    On The Day of Surgery:  You must have a driver to take you home after surgery, you will be asked not to drive for 24 hours following surgery.  Taxi, Gisele and non-medical transport will not be acceptable means of transportation unless you have a responsible individual who will be traveling with you.  Visitors in the surgical area:   2 people will be able to visit you in your room once your preparation for surgery has been completed. During surgery, your visitors will be asked to wait in the Surgery Waiting Area.  It is not a requirement for them to stay, if they prefer to leave and come back.  Your visitor(s) will be given an update once the surgery has been completed.  No visitors are allowed in the initial recovery room to respect patient privacy and safety.  Once you are more awake and transfer to the secondary recovery area, or are transferred to an  inpatient room, visitors will again be able to see you.  To respect and protect your privacy: We will ask on the day of surgery who your driver will be and what the contact number for that individual will be. We will ask if it is okay to share information with this individual, or if there is an alternative individual that we, or the surgeon, should contact to provide updates and information. If family or friends come to the surgical information desk requesting information about you, who you have not listed with us , no information will be given.   It may be helpful to designate someone as the main contact who will be responsible for updating your other friends  and family.    PREADMIT TESTING OFFICE: [phone] SAME DAY SURGERY: [phone] We look forward to caring for you before and throughout the process of your surgery.

## 2024-06-21 ENCOUNTER — Other Ambulatory Visit: Payer: Self-pay

## 2024-06-21 ENCOUNTER — Encounter
Admission: RE | Admit: 2024-06-21 | Discharge: 2024-06-21 | Disposition: A | Discharge status: Home / Self Care | Source: Ambulatory Visit | Attending: Neurosurgery | Admitting: Neurosurgery

## 2024-06-21 VITALS — BP 137/72 | HR 85 | Temp 97.8°F | Resp 16 | Ht 70.0 in | Wt 200.7 lb

## 2024-06-21 DIAGNOSIS — M5442 Lumbago with sciatica, left side: Secondary | ICD-10-CM | POA: Insufficient documentation

## 2024-06-21 DIAGNOSIS — G8929 Other chronic pain: Secondary | ICD-10-CM | POA: Insufficient documentation

## 2024-06-21 DIAGNOSIS — Z01818 Encounter for other preprocedural examination: Secondary | ICD-10-CM | POA: Insufficient documentation

## 2024-06-21 DIAGNOSIS — M48061 Spinal stenosis, lumbar region without neurogenic claudication: Secondary | ICD-10-CM | POA: Insufficient documentation

## 2024-06-21 DIAGNOSIS — M4316 Spondylolisthesis, lumbar region: Secondary | ICD-10-CM | POA: Insufficient documentation

## 2024-06-21 DIAGNOSIS — M5441 Lumbago with sciatica, right side: Secondary | ICD-10-CM | POA: Diagnosis not present

## 2024-06-21 DIAGNOSIS — M532X6 Spinal instabilities, lumbar region: Secondary | ICD-10-CM | POA: Diagnosis not present

## 2024-06-21 DIAGNOSIS — Z01810 Encounter for preprocedural cardiovascular examination: Secondary | ICD-10-CM | POA: Diagnosis not present

## 2024-06-21 DIAGNOSIS — Z01812 Encounter for preprocedural laboratory examination: Secondary | ICD-10-CM

## 2024-06-21 HISTORY — DX: Unspecified osteoarthritis, unspecified site: M19.90

## 2024-06-21 HISTORY — DX: Exudative age-related macular degeneration, left eye, with active choroidal neovascularization: H35.3221

## 2024-06-21 HISTORY — DX: Panic disorder (episodic paroxysmal anxiety): F41.0

## 2024-06-21 LAB — URINALYSIS, ROUTINE W REFLEX MICROSCOPIC
Bilirubin Urine: NEGATIVE
Glucose, UA: NEGATIVE mg/dL
Hgb urine dipstick: NEGATIVE
Ketones, ur: NEGATIVE mg/dL
Leukocytes,Ua: NEGATIVE
Nitrite: NEGATIVE
Protein, ur: NEGATIVE mg/dL
Specific Gravity, Urine: 1.014 (ref 1.005–1.030)
pH: 5 (ref 5.0–8.0)

## 2024-06-21 LAB — TYPE AND SCREEN
ABO/RH(D): O NEG
Antibody Screen: NEGATIVE

## 2024-06-21 LAB — SURGICAL PCR SCREEN
MRSA, PCR: NEGATIVE
Staphylococcus aureus: NEGATIVE

## 2024-06-21 NOTE — Patient Instructions (Addendum)
 Your procedure is scheduled on: Monday 06/29/23 Report to the Registration Desk on the 1st floor of the Medical Mall. To find out your arrival time, please call [phone] between 1PM - 3PM on: Friday 06/26/23 If your arrival time is 6:00 am, do not arrive before that time as the Medical Mall entrance doors do not open until 6:00 am.  REMEMBER: Instructions that are not followed completely may result in serious medical risk, up to and including death; or upon the discretion of your surgeon and anesthesiologist your surgery may need to be rescheduled.  Do not eat food after midnight the night before surgery.  No gum chewing or hard candies.  You may however, drink CLEAR liquids up to 2 hours before you are scheduled to arrive for your surgery. Do not drink anything within 2 hours of your scheduled arrival time.  Clear liquids include: - water  - apple juice without pulp - gatorade (not RED colors) - black coffee or tea (Do NOT add milk or creamers to the coffee or tea) Do NOT drink anything that is not on this list.  One week prior to surgery: Stop Anti-inflammatories (NSAIDS) such as Advil, Aleve, Ibuprofen, Motrin, Naproxen, Naprosyn and Aspirin based products such as Excedrin, Goody's Powder, BC Powder. Stop ANY OVER THE COUNTER supplements until after surgery. cyanocobalamin (VITAMIN B12)  Multiple Vitamins-Minerals  cholecalciferol (VITAMIN D3)  magnesium oxide (MAG-OX) 400 (240 Mg) MG   You may however, continue to take Tylenol  if needed for pain up until the day of surgery.  Continue taking all of your other prescription medications up until the day of surgery.  ON THE DAY OF SURGERY ONLY TAKE THESE MEDICATIONS WITH SIPS OF WATER:  allopurinol  (ZYLOPRIM ) 300 MG  ALPRAZolam  (XANAX ) 0.25 MG (if needed)  No Alcohol for 24 hours before or after surgery.  No Smoking including e-cigarettes for 24 hours before surgery.  No chewable tobacco products for at least 6 hours before  surgery.  No nicotine patches on the day of surgery.  Do not use any recreational drugs for at least a week (preferably 2 weeks) before your surgery.  Please be advised that the combination of cocaine and anesthesia may have negative outcomes, up to and including death. If you test positive for cocaine, your surgery will be cancelled.  On the morning of surgery brush your teeth with toothpaste and water, you may rinse your mouth with mouthwash if you wish. Do not swallow any toothpaste or mouthwash.  Use CHG Soap or wipes as directed on instruction sheet.  Do not wear jewelry, make-up, hairpins, clips or nail polish.  For welded (permanent) jewelry: bracelets, anklets, waist bands, etc.  Please have this removed prior to surgery.  If it is not removed, there is a chance that hospital personnel will need to cut it off on the day of surgery.  Do not wear lotions, powders, or perfumes.   Do not shave body hair from the neck down 48 hours before surgery.  Contact lenses, hearing aids and dentures may not be worn into surgery.  Do not bring valuables to the hospital. [facility] is not responsible for any missing/lost belongings or valuables.   Notify your doctor if there is any change in your medical condition (cold, fever, infection).  Wear comfortable clothing (specific to your surgery type) to the hospital.  After surgery, you can help prevent lung complications by doing breathing exercises.  Take deep breaths and cough every 1-2 hours. Your doctor may order  a device called an Incentive Spirometer to help you take deep breaths. When coughing or sneezing, hold a pillow firmly against your incision with both hands. This is called splinting. Doing this helps protect your incision. It also decreases belly discomfort.  If you are being admitted to the hospital overnight, leave your suitcase in the car. After surgery it may be brought to your room.  In case of increased patient census,  it may be necessary for you, the patient, to continue your postoperative care in the Same Day Surgery department.  If you are being discharged the day of surgery, you will not be allowed to drive home. You will need a responsible individual to drive you home and stay with you for 24 hours after surgery.   If you are taking public transportation, you will need to have a responsible individual with you.  Please call the Pre-admissions Testing Dept. at [phone] if you have any questions about these instructions.  Surgery Visitation Policy:  Patients having surgery or a procedure may have two visitors.  Children under the age of 29 must have an adult with them who is not the patient.  Inpatient Visitation:    Visiting hours are 7 a.m. to 8 p.m. Up to four visitors are allowed at one time in a patient room. The visitors may rotate out with other people during the day.  One visitor age 23 or older may stay with the patient overnight and must be in the room by 8 p.m.   Merchandiser, Retail to address health-related social needs:  [email or web address]    Pre-operative 4 CHG Bath Instructions   You can play a key role in reducing the risk of infection after surgery. Your skin needs to be as free of germs as possible. You can reduce the number of germs on your skin by washing with CHG (chlorhexidine  gluconate) soap before surgery. CHG is an antiseptic soap that kills germs and continues to kill germs even after washing.   DO NOT use if you have an allergy to chlorhexidine /CHG or antibacterial soaps. If your skin becomes reddened or irritated, stop using the CHG and notify one of our RNs at [phone].   Please shower with the CHG soap starting 4 days before surgery using the following schedule:     Please keep in mind the following:  DO NOT shave, including legs and underarms, starting the day of your first shower.   You may shave your face at any point  before/day of surgery.  Place clean sheets on your bed the day you start using CHG soap. Use a clean washcloth (not used since being washed) for each shower. DO NOT sleep with pets once you start using the CHG.   CHG Shower Instructions:  If you choose to wash your hair and private area, wash first with your normal shampoo/soap.  After you use shampoo/soap, rinse your hair and body thoroughly to remove shampoo/soap residue.  Turn the water OFF and apply about 3 tablespoons (45 ml) of CHG soap to a CLEAN washcloth.  Apply CHG soap ONLY FROM YOUR NECK DOWN TO YOUR TOES (washing for 3-5 minutes)  DO NOT use CHG soap on face, private areas, open wounds, or sores.  Pay special attention to the area where your surgery is being performed.  If you are having back surgery, having someone wash your back for you may be helpful. Wait 2 minutes after CHG soap is applied, then you may rinse off the CHG soap.  Pat dry with a clean towel  Put on clean clothes/pajamas   If you choose to wear lotion, please use ONLY the CHG-compatible lotions on the back of this paper.     Additional instructions for the day of surgery: DO NOT APPLY any lotions, deodorants, cologne, or perfumes.   Put on clean/comfortable clothes.  Brush your teeth.  Ask your nurse before applying any prescription medications to the skin.      CHG Compatible Lotions   Aveeno Moisturizing lotion  Cetaphil Moisturizing Cream  Cetaphil Moisturizing Lotion  Clairol Herbal Essence Moisturizing Lotion, Dry Skin  Clairol Herbal Essence Moisturizing Lotion, Extra Dry Skin  Clairol Herbal Essence Moisturizing Lotion, Normal Skin  Curel Age Defying Therapeutic Moisturizing Lotion with Alpha Hydroxy  Curel Extreme Care Body Lotion  Curel Soothing Hands Moisturizing Hand Lotion  Curel Therapeutic Moisturizing Cream, Fragrance-Free  Curel Therapeutic Moisturizing Lotion, Fragrance-Free  Curel Therapeutic Moisturizing Lotion, Original  Formula  Eucerin Daily Replenishing Lotion  Eucerin Dry Skin Therapy Plus Alpha Hydroxy Crme  Eucerin Dry Skin Therapy Plus Alpha Hydroxy Lotion  Eucerin Original Crme  Eucerin Original Lotion  Eucerin Plus Crme Eucerin Plus Lotion  Eucerin TriLipid Replenishing Lotion  Keri Anti-Bacterial Hand Lotion  Keri Deep Conditioning Original Lotion Dry Skin Formula Softly Scented  Keri Deep Conditioning Original Lotion, Fragrance Free Sensitive Skin Formula  Keri Lotion Fast Absorbing Fragrance Free Sensitive Skin Formula  Keri Lotion Fast Absorbing Softly Scented Dry Skin Formula  Keri Original Lotion  Keri Skin Renewal Lotion Keri Silky Smooth Lotion  Keri Silky Smooth Sensitive Skin Lotion  Nivea Body Creamy Conditioning Oil  Nivea Body Extra Enriched Lotion  Nivea Body Original Lotion  Nivea Body Sheer Moisturizing Lotion Nivea Crme  Nivea Skin Firming Lotion  NutraDerm 30 Skin Lotion  NutraDerm Skin Lotion  NutraDerm Therapeutic Skin Cream  NutraDerm Therapeutic Skin Lotion  ProShield Protective Hand Cream  Provon moisturizing lotion  How to Use an Incentive Spirometer  An incentive spirometer is a tool that measures how well you are filling your lungs with each breath. Learning to take long, deep breaths using this tool can help you keep your lungs clear and active. This may help to reverse or lessen your chance of developing breathing (pulmonary) problems, especially infection. You may be asked to use a spirometer: After a surgery. If you have a lung problem or a history of smoking. After a long period of time when you have been unable to move or be active. If the spirometer includes an indicator to show the highest number that you have reached, your health care provider or respiratory therapist will help you set a goal. Keep a log of your progress as told by your health care provider. What are the risks? Breathing too quickly may cause dizziness or cause you to pass out. Take  your time so you do not get dizzy or light-headed. If you are in pain, you may need to take pain medicine before doing incentive spirometry. It is harder to take a deep breath if you are having pain. How to use your incentive spirometer  Sit up on the edge of your bed or on a chair. Hold the incentive spirometer so that it is in an upright position. Before you use the spirometer, breathe out normally. Place the mouthpiece in your mouth. Make sure your lips are closed tightly around it. Breathe in slowly and as deeply as you can through your mouth, causing the piston or the ball to rise  toward the top of the chamber. Hold your breath for 3-5 seconds, or for as long as possible. If the spirometer includes a coach indicator, use this to guide you in breathing. Slow down your breathing if the indicator goes above the marked areas. Remove the mouthpiece from your mouth and breathe out normally. The piston or ball will return to the bottom of the chamber. Rest for a few seconds, then repeat the steps 10 or more times. Take your time and take a few normal breaths between deep breaths so that you do not get dizzy or light-headed. Do this every 1-2 hours when you are awake. If the spirometer includes a goal marker to show the highest number you have reached (best effort), use this as a goal to work toward during each repetition. After each set of 10 deep breaths, cough a few times. This will help to make sure that your lungs are clear. If you have an incision on your chest or abdomen from surgery, place a pillow or a rolled-up towel firmly against the incision when you cough. This can help to reduce pain while taking deep breaths and coughing. General tips When you are able to get out of bed: Walk around often. Continue to take deep breaths and cough in order to clear your lungs. Keep using the incentive spirometer until your health care provider says it is okay to stop using it. If you have been in the  hospital, you may be told to keep using the spirometer at home. Contact a health care provider if: You are having difficulty using the spirometer. You have trouble using the spirometer as often as instructed. Your pain medicine is not giving enough relief for you to use the spirometer as told. You have a fever. Get help right away if: You develop shortness of breath. You develop a cough with bloody mucus from the lungs. You have fluid or blood coming from an incision site after you cough. Summary An incentive spirometer is a tool that can help you learn to take long, deep breaths to keep your lungs clear and active. You may be asked to use a spirometer after a surgery, if you have a lung problem or a history of smoking, or if you have been inactive for a long period of time. Use your incentive spirometer as instructed every 1-2 hours while you are awake. If you have an incision on your chest or abdomen, place a pillow or a rolled-up towel firmly against your incision when you cough. This will help to reduce pain. Get help right away if you have shortness of breath, you cough up bloody mucus, or blood comes from your incision when you cough. This information is not intended to replace advice given to you by your health care provider. Make sure you discuss any questions you have with your health care provider. Document Revised: 08/23/2019 Document Reviewed: 08/23/2019 Elsevier Patient Education  2023 [employer].

## 2024-06-25 NOTE — Anesthesia Preprocedure Evaluation (Addendum)
 "                                  Anesthesia Evaluation  Patient identified by MRN, date of birth, ID band Patient awake    Reviewed: Allergy & Precautions, H&P , NPO status , Patient's Chart, lab work & pertinent test results  Airway Mallampati: III  TM Distance: >3 FB Neck ROM: full    Dental no notable dental hx.    Pulmonary neg pulmonary ROS, former smoker   Pulmonary exam normal        Cardiovascular negative cardio ROS Normal cardiovascular exam     Neuro/Psych  PSYCHIATRIC DISORDERS Anxiety      Neuromuscular disease (peripheral neuropathy)    GI/Hepatic negative GI ROS, Neg liver ROS,,,  Endo/Other  negative endocrine ROS    Renal/GU      Musculoskeletal  (+) Arthritis ,    Abdominal Normal abdominal exam  (+)   Peds  Hematology negative hematology ROS (+)   Anesthesia Other Findings Past Medical History: No date: Actinic keratosis     Comment:  Hx PDT No date: Anxiety     Comment:  Panic Attack No date: Arthritis 04/04/2022: Family history of breast cancer 04/04/2022: Family history of pancreatic cancer No date: Gout 04/04/2022: History of colonic polyps No date: Hyperlipidemia No date: Motion sickness     Comment:  ocean boats No date: Osteoarthritis No date: Panic attack No date: 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HCC) No date: Prediabetes     Comment:  no meds No date: PVD (peripheral vascular disease) No date: Shingles No date: Spinal stenosis No date: Wears hearing aid in both ears No date: 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 [facility])  Past Surgical History: arm: benign tumor removed; Right No date: BILATERAL HIP ARTHROSCOPY 10/15/2023: CATARACT EXTRACTION W/PHACO; Left     Comment:  Procedure: PHACOEMULSIFICATION, CATARACT, WITH IOL               INSERTION 7.54 00:34.1;  Surgeon: Mittie Gaskin,               MD;  Location: [facility];  Service:                Ophthalmology;  Laterality: Left; 10/29/2023: CATARACT EXTRACTION W/PHACO; Right     Comment:  Procedure: PHACOEMULSIFICATION, CATARACT, WITH IOL               INSERTION 6.00 00:29.5;  Surgeon: Mittie Gaskin,               MD;  Location: [facility] SUR[facility]ERY [facility];  Service:               Ophthalmology;  Laterality: Right; 04/07/2015: COLONOSCOPY WITH PROPOFOL ; N/A     Comment:  Procedure: COLONOSCOPY WITH PROPOFOL ;  Surgeon: Lamar ONEIDA Holmes, MD;  Location: [facility] ENDOSCOPY;  Service:               Endoscopy;  Laterality: N/A; 05/13/2022: COLONOSCOPY WITH PROPOFOL ; N/A     Comment:  Procedure: COLONOSCOPY WITH PROPOFOL ;  Surgeon:               Maryruth Ole ONEIDA, MD;  Location: [facility] ENDOSCOPY;                Service: Endoscopy;  Laterality: N/A; 04/07/2015: ESOPHAGOGASTRODUODENOSCOPY (EGD) WITH  PROPOFOL ; N/A     Comment:  Procedure: ESOPHAGOGASTRODUODENOSCOPY (EGD) WITH               PROPOFOL ;  Surgeon: Lamar ONEIDA Holmes, MD;  Location: [facility]              ENDOSCOPY;  Service: Endoscopy;  Laterality: N/A; 05/13/2022: ESOPHAGOGASTRODUODENOSCOPY (EGD) WITH PROPOFOL ; N/A     Comment:  Procedure: ESOPHAGOGASTRODUODENOSCOPY (EGD) WITH               PROPOFOL ;  Surgeon: Maryruth Ole ONEIDA, MD;  Location:               [facility] ENDOSCOPY;  Service: Endoscopy;  Laterality: N/A; No date: EYE SURGERY No date: JOINT REPLACEMENT; Bilateral     Comment:  1992 and revision 1997 06/26/2016: LUMBAR LAMINECTOMY/DECOMPRESSION MICRODISCECTOMY; N/A     Comment:  Procedure: LUMBAR LAMINECTOMY/DECOMPRESSION               MICRODISCECTOMY 2 LEVELS L5-S1;  Surgeon: Reeves Daisy, MD;  Location: [facility] ORS;  Service:               Neurosurgery;  Laterality: N/A; No date: TONSILLECTOMY  BMI    Body Mass Index: 28.79 kg/m      Reproductive/Obstetrics negative OB ROS                              Anesthesia Physical Anesthesia Plan  ASA: 2  Anesthesia  Plan: General ETT   Post-op Pain Management: Tylenol  PO (pre-op)*, Celebrex  PO (pre-op)* and Gabapentin  PO (pre-op)*   Induction: Intravenous  PONV Risk Score and Plan: 2 and Ondansetron , Dexamethasone  and Midazolam   Airway Management Planned: Oral ETT  Additional Equipment:   Intra-op Plan:   Post-operative Plan: Extubation in OR  Informed Consent: I have reviewed the patients History and Physical, chart, labs and discussed the procedure including the risks, benefits and alternatives for the proposed anesthesia with the patient or authorized representative who has indicated his/her understanding and acceptance.     Dental Advisory Given  Plan Discussed with: CRNA and Surgeon  Anesthesia Plan Comments:          Anesthesia Quick Evaluation  "

## 2024-06-28 ENCOUNTER — Other Ambulatory Visit: Payer: Self-pay

## 2024-06-28 ENCOUNTER — Inpatient Hospital Stay: Payer: Self-pay | Admitting: Certified Registered"

## 2024-06-28 ENCOUNTER — Encounter: Payer: Self-pay | Admitting: Certified Registered"

## 2024-06-28 ENCOUNTER — Encounter: Admission: RE | Payer: Self-pay | Source: Home / Self Care

## 2024-06-28 ENCOUNTER — Ambulatory Visit

## 2024-06-28 ENCOUNTER — Encounter: Payer: Self-pay | Admitting: Neurosurgery

## 2024-06-28 ENCOUNTER — Inpatient Hospital Stay
Admission: RE | Admit: 2024-06-28 | Discharge: 2024-06-30 | DRG: 402 | Disposition: A | Discharge status: Home Health | Attending: Neurosurgery | Admitting: Neurosurgery

## 2024-06-28 DIAGNOSIS — Z974 Presence of external hearing-aid: Secondary | ICD-10-CM

## 2024-06-28 DIAGNOSIS — Z87891 Personal history of nicotine dependence: Secondary | ICD-10-CM

## 2024-06-28 DIAGNOSIS — M5441 Lumbago with sciatica, right side: Secondary | ICD-10-CM | POA: Diagnosis present

## 2024-06-28 DIAGNOSIS — L570 Actinic keratosis: Secondary | ICD-10-CM | POA: Diagnosis present

## 2024-06-28 DIAGNOSIS — Z860100 Personal history of colon polyps, unspecified: Secondary | ICD-10-CM

## 2024-06-28 DIAGNOSIS — Z8619 Personal history of other infectious and parasitic diseases: Secondary | ICD-10-CM

## 2024-06-28 DIAGNOSIS — M4316 Spondylolisthesis, lumbar region: Secondary | ICD-10-CM | POA: Diagnosis not present

## 2024-06-28 DIAGNOSIS — M532X6 Spinal instabilities, lumbar region: Secondary | ICD-10-CM | POA: Diagnosis not present

## 2024-06-28 DIAGNOSIS — E785 Hyperlipidemia, unspecified: Secondary | ICD-10-CM | POA: Diagnosis present

## 2024-06-28 DIAGNOSIS — Z01818 Encounter for other preprocedural examination: Secondary | ICD-10-CM

## 2024-06-28 DIAGNOSIS — M48061 Spinal stenosis, lumbar region without neurogenic claudication: Secondary | ICD-10-CM | POA: Diagnosis present

## 2024-06-28 DIAGNOSIS — G8929 Other chronic pain: Secondary | ICD-10-CM | POA: Diagnosis not present

## 2024-06-28 DIAGNOSIS — Z881 Allergy status to other antibiotic agents status: Secondary | ICD-10-CM

## 2024-06-28 DIAGNOSIS — M5442 Lumbago with sciatica, left side: Secondary | ICD-10-CM | POA: Diagnosis present

## 2024-06-28 DIAGNOSIS — E1140 Type 2 diabetes mellitus with diabetic neuropathy, unspecified: Secondary | ICD-10-CM | POA: Diagnosis present

## 2024-06-28 DIAGNOSIS — E1151 Type 2 diabetes mellitus with diabetic peripheral angiopathy without gangrene: Secondary | ICD-10-CM | POA: Diagnosis present

## 2024-06-28 DIAGNOSIS — Z79899 Other long term (current) drug therapy: Secondary | ICD-10-CM

## 2024-06-28 DIAGNOSIS — Q8589 Other phakomatoses, not elsewhere classified: Secondary | ICD-10-CM

## 2024-06-28 DIAGNOSIS — M1990 Unspecified osteoarthritis, unspecified site: Secondary | ICD-10-CM | POA: Diagnosis present

## 2024-06-28 DIAGNOSIS — Z981 Arthrodesis status: Principal | ICD-10-CM

## 2024-06-28 HISTORY — PX: APPLICATION OF INTRAOPERATIVE CT SCAN: SHX6668

## 2024-06-28 HISTORY — PX: ANTERIOR LATERAL LUMBAR FUSION WITH PERCUTANEOUS SCREW 1 LEVEL: SHX5553

## 2024-06-28 LAB — CREATININE, SERUM
Creatinine, Ser: 0.74 mg/dL (ref 0.61–1.24)
GFR, Estimated: >60 mL/min

## 2024-06-28 LAB — ABO/RH: ABO/RH(D): O NEG

## 2024-06-28 MED ORDER — ACETAMINOPHEN 10 MG/ML IV SOLN: 1000.0000 mg | Freq: Once | INTRAVENOUS | Status: DC | PRN

## 2024-06-28 MED ORDER — LIDOCAINE HCL (CARDIAC) PF 100 MG/5ML IV SOSY
PREFILLED_SYRINGE | INTRAVENOUS | Status: DC | PRN
  Administered 2024-06-28: 100 mg via INTRAVENOUS

## 2024-06-28 MED ORDER — LATANOPROST 0.005 % OP SOLN
1.0000 [drp] | Freq: Every day | OPHTHALMIC | Status: DC
  Administered 2024-06-29: 1 [drp] via OPHTHALMIC
  Filled 2024-06-28 (×2): qty 2.5

## 2024-06-28 MED ORDER — LIDOCAINE HCL (PF) 2 % IJ SOLN
INTRAMUSCULAR | Status: AC
  Filled 2024-06-28: qty 5

## 2024-06-28 MED ORDER — KETAMINE HCL 50 MG/5ML IJ SOSY
PREFILLED_SYRINGE | INTRAMUSCULAR | Status: DC | PRN
  Administered 2024-06-28: 20 mg via INTRAVENOUS

## 2024-06-28 MED ORDER — ALLOPURINOL 300 MG PO TABS
300.0000 mg | ORAL_TABLET | Freq: Every day | ORAL | Status: DC
  Administered 2024-06-29 – 2024-06-30 (×2): 300 mg via ORAL
  Filled 2024-06-28 (×2): qty 1

## 2024-06-28 MED ORDER — METHOCARBAMOL 500 MG PO TABS
500.0000 mg | ORAL_TABLET | Freq: Four times a day (QID) | ORAL | Status: DC | PRN
  Administered 2024-06-28 – 2024-06-29 (×3): 500 mg via ORAL
  Filled 2024-06-28 (×2): qty 1

## 2024-06-28 MED ORDER — MIDAZOLAM HCL (PF) 2 MG/2ML IJ SOLN
INTRAMUSCULAR | Status: DC | PRN
  Administered 2024-06-28: 1 mg via INTRAVENOUS

## 2024-06-28 MED ORDER — ENOXAPARIN SODIUM 40 MG/0.4ML IJ SOSY
40.0000 mg | PREFILLED_SYRINGE | INTRAMUSCULAR | Status: DC
  Administered 2024-06-29 – 2024-06-30 (×2): 40 mg via SUBCUTANEOUS
  Filled 2024-06-28 (×2): qty 0.4

## 2024-06-28 MED ORDER — LACTATED RINGERS IV SOLN: INTRAVENOUS | Status: DC

## 2024-06-28 MED ORDER — GABAPENTIN 300 MG PO CAPS
300.0000 mg | ORAL_CAPSULE | Freq: Once | ORAL | Status: AC
  Administered 2024-06-28: 300 mg via ORAL

## 2024-06-28 MED ORDER — SODIUM CHLORIDE 0.9 % IV SOLN
250.0000 mL | INTRAVENOUS | Status: DC
  Administered 2024-06-28: 250 mL via INTRAVENOUS

## 2024-06-28 MED ORDER — DOCUSATE SODIUM 100 MG PO CAPS
100.0000 mg | ORAL_CAPSULE | Freq: Two times a day (BID) | ORAL | Status: DC
  Administered 2024-06-28 – 2024-06-30 (×3): 100 mg via ORAL
  Filled 2024-06-28 (×4): qty 1

## 2024-06-28 MED ORDER — MENTHOL 3 MG MT LOZG
1.0000 | LOZENGE | OROMUCOSAL | Status: DC | PRN
  Administered 2024-06-29: 3 mg via ORAL
  Filled 2024-06-28: qty 9

## 2024-06-28 MED ORDER — ONDANSETRON HCL 4 MG/2ML IJ SOLN
INTRAMUSCULAR | Status: DC | PRN
  Administered 2024-06-28: 4 mg via INTRAVENOUS

## 2024-06-28 MED ORDER — DROPERIDOL 2.5 MG/ML IJ SOLN: 0.6250 mg | Freq: Once | INTRAMUSCULAR | Status: DC | PRN

## 2024-06-28 MED ORDER — OXYCODONE HCL 5 MG PO TABS
10.0000 mg | ORAL_TABLET | ORAL | Status: DC | PRN
  Administered 2024-06-29: 10 mg via ORAL
  Filled 2024-06-28: qty 2

## 2024-06-28 MED ORDER — DEXAMETHASONE SOD PHOSPHATE PF 10 MG/ML IJ SOLN
INTRAMUSCULAR | Status: DC | PRN
  Administered 2024-06-28: 10 mg via INTRAVENOUS

## 2024-06-28 MED ORDER — CHLORHEXIDINE GLUCONATE 0.12 % MT SOLN
15.0000 mL | Freq: Once | OROMUCOSAL | Status: AC
  Administered 2024-06-28: 15 mL via OROMUCOSAL

## 2024-06-28 MED ORDER — ONDANSETRON HCL 4 MG/2ML IJ SOLN
INTRAMUSCULAR | Status: AC
  Filled 2024-06-28: qty 2

## 2024-06-28 MED ORDER — BUPIVACAINE-EPINEPHRINE 0.5% -1:200000 IJ SOLN
INTRAMUSCULAR | Status: DC | PRN
  Administered 2024-06-28: 16 mL

## 2024-06-28 MED ORDER — SENNA 8.6 MG PO TABS
1.0000 | ORAL_TABLET | Freq: Two times a day (BID) | ORAL | Status: DC
  Administered 2024-06-28 – 2024-06-30 (×4): 8.6 mg via ORAL
  Filled 2024-06-28 (×4): qty 1

## 2024-06-28 MED ORDER — METHOCARBAMOL 1000 MG/10ML IJ SOLN: 500.0000 mg | Freq: Four times a day (QID) | INTRAMUSCULAR | Status: DC | PRN

## 2024-06-28 MED ORDER — HYDROMORPHONE HCL 1 MG/ML IJ SOLN: 0.5000 mg | INTRAMUSCULAR | Status: AC | PRN

## 2024-06-28 MED ORDER — REMIFENTANIL HCL 1 MG IV SOLR
INTRAVENOUS | Status: AC
  Filled 2024-06-28: qty 1000

## 2024-06-28 MED ORDER — PROPOFOL 10 MG/ML IV BOLUS
INTRAVENOUS | Status: AC
  Filled 2024-06-28: qty 20

## 2024-06-28 MED ORDER — PHENYLEPHRINE 80 MCG/ML (10ML) SYRINGE FOR IV PUSH (FOR BLOOD PRESSURE SUPPORT)
PREFILLED_SYRINGE | INTRAVENOUS | Status: AC
  Filled 2024-06-28: qty 10

## 2024-06-28 MED ORDER — PROPOFOL 1000 MG/100ML IV EMUL
INTRAVENOUS | Status: AC
  Filled 2024-06-28: qty 100

## 2024-06-28 MED ORDER — SODIUM CHLORIDE 0.9 % IV SOLN
INTRAVENOUS | Status: DC | PRN
  Administered 2024-06-28: .12 ug/kg/min via INTRAVENOUS

## 2024-06-28 MED ORDER — PHENYLEPHRINE 80 MCG/ML (10ML) SYRINGE FOR IV PUSH (FOR BLOOD PRESSURE SUPPORT)
PREFILLED_SYRINGE | INTRAVENOUS | Status: DC | PRN
  Administered 2024-06-28: 160 ug via INTRAVENOUS
  Administered 2024-06-28: 80 ug via INTRAVENOUS

## 2024-06-28 MED ORDER — MAGNESIUM OXIDE -MG SUPPLEMENT 400 (240 MG) MG PO TABS
400.0000 mg | ORAL_TABLET | Freq: Every day | ORAL | Status: DC
  Administered 2024-06-28 – 2024-06-30 (×3): 400 mg via ORAL
  Filled 2024-06-28 (×3): qty 1

## 2024-06-28 MED ORDER — PHENOL 1.4 % MT LIQD: 1.0000 | OROMUCOSAL | Status: DC | PRN

## 2024-06-28 MED ORDER — 0.9 % SODIUM CHLORIDE (POUR BTL) OPTIME
TOPICAL | Status: DC | PRN
  Administered 2024-06-28: 500 mL

## 2024-06-28 MED ORDER — PROPOFOL 10 MG/ML IV BOLUS
INTRAVENOUS | Status: DC | PRN
  Administered 2024-06-28: 150 mg via INTRAVENOUS
  Administered 2024-06-28: 150 ug/kg/min via INTRAVENOUS

## 2024-06-28 MED ORDER — CEFAZOLIN SODIUM-DEXTROSE 2-4 GM/100ML-% IV SOLN
INTRAVENOUS | Status: AC
  Filled 2024-06-28: qty 100

## 2024-06-28 MED ORDER — ACETAMINOPHEN 650 MG RE SUPP: 650.0000 mg | RECTAL | Status: DC | PRN

## 2024-06-28 MED ORDER — TRAMADOL HCL 50 MG PO TABS: 25.0000 mg | ORAL_TABLET | Freq: Once | ORAL | Status: DC | PRN

## 2024-06-28 MED ORDER — MAGNESIUM CITRATE PO SOLN: 1.0000 | Freq: Once | ORAL | Status: DC | PRN

## 2024-06-28 MED ORDER — KETAMINE HCL 50 MG/5ML IJ SOSY
PREFILLED_SYRINGE | INTRAMUSCULAR | Status: AC
  Filled 2024-06-28: qty 5

## 2024-06-28 MED ORDER — VITAMIN D 25 MCG (1000 UNIT) PO TABS
1000.0000 [IU] | ORAL_TABLET | Freq: Every day | ORAL | Status: DC
  Administered 2024-06-28 – 2024-06-30 (×3): 1000 [IU] via ORAL
  Filled 2024-06-28 (×3): qty 1

## 2024-06-28 MED ORDER — DEXAMETHASONE SOD PHOSPHATE PF 10 MG/ML IJ SOLN
INTRAMUSCULAR | Status: AC
  Filled 2024-06-28: qty 1

## 2024-06-28 MED ORDER — SUCCINYLCHOLINE CHLORIDE 200 MG/10ML IV SOSY
PREFILLED_SYRINGE | INTRAVENOUS | Status: AC
  Filled 2024-06-28: qty 10

## 2024-06-28 MED ORDER — PHENYLEPHRINE HCL-NACL 20-0.9 MG/250ML-% IV SOLN
INTRAVENOUS | Status: DC | PRN
  Administered 2024-06-28: 45 ug/min via INTRAVENOUS

## 2024-06-28 MED ORDER — ACETAMINOPHEN 500 MG PO TABS
1000.0000 mg | ORAL_TABLET | Freq: Four times a day (QID) | ORAL | Status: DC
  Administered 2024-06-28 – 2024-06-30 (×7): 1000 mg via ORAL
  Filled 2024-06-28 (×7): qty 2

## 2024-06-28 MED ORDER — SURGIFLO WITH THROMBIN (HEMOSTATIC MATRIX KIT) OPTIME
TOPICAL | Status: DC | PRN
  Administered 2024-06-28: 1 via TOPICAL

## 2024-06-28 MED ORDER — POLYETHYLENE GLYCOL 3350 17 G PO PACK: 17.0000 g | PACK | Freq: Every day | ORAL | Status: DC | PRN

## 2024-06-28 MED ORDER — SODIUM CHLORIDE 0.9% FLUSH
3.0000 mL | Freq: Two times a day (BID) | INTRAVENOUS | Status: DC
  Administered 2024-06-28 – 2024-06-30 (×4): 3 mL via INTRAVENOUS

## 2024-06-28 MED ORDER — KETOROLAC TROMETHAMINE 15 MG/ML IJ SOLN
7.5000 mg | Freq: Four times a day (QID) | INTRAMUSCULAR | Status: AC
  Administered 2024-06-28 – 2024-06-29 (×4): 7.5 mg via INTRAVENOUS
  Filled 2024-06-28 (×3): qty 1

## 2024-06-28 MED ORDER — CELECOXIB 200 MG PO CAPS
200.0000 mg | ORAL_CAPSULE | Freq: Once | ORAL | Status: AC
  Administered 2024-06-28: 200 mg via ORAL

## 2024-06-28 MED ORDER — VITAMIN B-12 1000 MCG PO TABS
1000.0000 ug | ORAL_TABLET | Freq: Every day | ORAL | Status: DC
  Administered 2024-06-28 – 2024-06-30 (×3): 1000 ug via ORAL
  Filled 2024-06-28 (×3): qty 1

## 2024-06-28 MED ORDER — FENTANYL CITRATE (PF) 100 MCG/2ML IJ SOLN
25.0000 ug | INTRAMUSCULAR | Status: AC | PRN
  Administered 2024-06-28 (×6): 25 ug via INTRAVENOUS

## 2024-06-28 MED ORDER — CEFAZOLIN IN SODIUM CHLORIDE 2-0.9 GM/100ML-% IV SOLN
2.0000 g | Freq: Once | INTRAVENOUS | Status: AC
  Administered 2024-06-28: 2 g via INTRAVENOUS

## 2024-06-28 MED ORDER — ALPRAZOLAM 0.25 MG PO TABS
0.1250 mg | ORAL_TABLET | Freq: Every day | ORAL | Status: DC | PRN
  Administered 2024-06-29: 0.125 mg via ORAL
  Filled 2024-06-28: qty 1

## 2024-06-28 MED ORDER — FENTANYL CITRATE (PF) 100 MCG/2ML IJ SOLN
INTRAMUSCULAR | Status: DC | PRN
  Administered 2024-06-28 (×2): 50 ug via INTRAVENOUS

## 2024-06-28 MED ORDER — OXYCODONE HCL 5 MG PO TABS
ORAL_TABLET | ORAL | Status: AC
  Filled 2024-06-28: qty 1

## 2024-06-28 MED ORDER — VANCOMYCIN HCL IN DEXTROSE 1-5 GM/200ML-% IV SOLN
1000.0000 mg | Freq: Once | INTRAVENOUS | Status: AC
  Administered 2024-06-28: 1000 mg via INTRAVENOUS

## 2024-06-28 MED ORDER — IRRISEPT - 450ML BOTTLE WITH 0.05% CHG IN STERILE WATER, USP 99.95% OPTIME
TOPICAL | Status: DC | PRN
  Administered 2024-06-28: 450 mL

## 2024-06-28 MED ORDER — ACETAMINOPHEN 500 MG PO TABS
ORAL_TABLET | ORAL | Status: AC
  Filled 2024-06-28: qty 2

## 2024-06-28 MED ORDER — MIDAZOLAM HCL 2 MG/2ML IJ SOLN
INTRAMUSCULAR | Status: AC
  Filled 2024-06-28: qty 2

## 2024-06-28 MED ORDER — PHENYLEPHRINE HCL-NACL 20-0.9 MG/250ML-% IV SOLN
INTRAVENOUS | Status: AC
  Filled 2024-06-28: qty 250

## 2024-06-28 MED ORDER — FENTANYL CITRATE (PF) 100 MCG/2ML IJ SOLN
INTRAMUSCULAR | Status: AC
  Filled 2024-06-28: qty 2

## 2024-06-28 MED ORDER — METHOCARBAMOL 500 MG PO TABS
ORAL_TABLET | ORAL | Status: AC
  Filled 2024-06-28: qty 1

## 2024-06-28 MED ORDER — CELECOXIB 200 MG PO CAPS
ORAL_CAPSULE | ORAL | Status: AC
  Filled 2024-06-28: qty 1

## 2024-06-28 MED ORDER — OXYCODONE HCL 5 MG PO TABS
5.0000 mg | ORAL_TABLET | ORAL | Status: DC | PRN
  Administered 2024-06-28 (×2): 5 mg via ORAL
  Filled 2024-06-28: qty 1

## 2024-06-28 MED ORDER — ACETAMINOPHEN 325 MG PO TABS: 650.0000 mg | ORAL_TABLET | ORAL | Status: DC | PRN

## 2024-06-28 MED ORDER — CHLORHEXIDINE GLUCONATE 0.12 % MT SOLN
OROMUCOSAL | Status: AC
  Filled 2024-06-28: qty 15

## 2024-06-28 MED ORDER — ORAL CARE MOUTH RINSE: 15.0000 mL | Freq: Once | OROMUCOSAL | Status: AC

## 2024-06-28 MED ORDER — ONDANSETRON HCL 4 MG PO TABS: 4.0000 mg | ORAL_TABLET | Freq: Four times a day (QID) | ORAL | Status: DC | PRN

## 2024-06-28 MED ORDER — GLYCOPYRROLATE 0.2 MG/ML IJ SOLN
INTRAMUSCULAR | Status: AC
  Filled 2024-06-28: qty 1

## 2024-06-28 MED ORDER — SORBITOL 70 % SOLN: 30.0000 mL | Freq: Every day | Status: DC | PRN

## 2024-06-28 MED ORDER — ONDANSETRON HCL 4 MG/2ML IJ SOLN: 4.0000 mg | Freq: Four times a day (QID) | INTRAMUSCULAR | Status: DC | PRN

## 2024-06-28 MED ORDER — GABAPENTIN 300 MG PO CAPS
ORAL_CAPSULE | ORAL | Status: AC
  Filled 2024-06-28: qty 1

## 2024-06-28 MED ORDER — SUCCINYLCHOLINE CHLORIDE 200 MG/10ML IV SOSY
PREFILLED_SYRINGE | INTRAVENOUS | Status: DC | PRN
  Administered 2024-06-28: 100 mg via INTRAVENOUS

## 2024-06-28 MED ORDER — ACETAMINOPHEN 500 MG PO TABS
1000.0000 mg | ORAL_TABLET | Freq: Once | ORAL | Status: AC
  Administered 2024-06-28: 1000 mg via ORAL

## 2024-06-28 MED ORDER — VANCOMYCIN HCL IN DEXTROSE 1-5 GM/200ML-% IV SOLN
INTRAVENOUS | Status: AC
  Filled 2024-06-28: qty 200

## 2024-06-28 MED ORDER — GABAPENTIN 400 MG PO CAPS
600.0000 mg | ORAL_CAPSULE | Freq: Every day | ORAL | Status: DC
  Administered 2024-06-29: 600 mg via ORAL
  Filled 2024-06-28: qty 2

## 2024-06-28 MED ORDER — KETOROLAC TROMETHAMINE 15 MG/ML IJ SOLN
INTRAMUSCULAR | Status: AC
  Filled 2024-06-28: qty 1

## 2024-06-28 MED ORDER — SODIUM CHLORIDE 0.9% FLUSH
3.0000 mL | INTRAVENOUS | Status: DC | PRN
  Administered 2024-06-28: 3 mL via INTRAVENOUS

## 2024-06-28 NOTE — Discharge Instructions (Signed)
 Your surgeon has performed an operation on your lumbar spine (low back) to relieve pressure on one or more nerves. Many times, patients feel better immediately after surgery and can "overdo it." Even if you feel well, it is important that you follow these activity guidelines. If you do not let your back heal properly from the surgery, you can increase the chance of hardware complications and/or return of your symptoms. The following are instructions to help in your recovery once you have been discharged from the hospital.  Do not use NSAIDs for 3 months after surgery.  *Regarding compression stockings-  Please wear day and night until you are walking a couple hundred feet three times a day.   Activity    No bending, lifting, or twisting ("BLT"). Avoid lifting objects heavier than 10 pounds (gallon milk jug).  Where possible, avoid household activities that involve lifting, bending, pushing, or pulling such as laundry, vacuuming, grocery shopping, and childcare. Try to arrange for help from friends and family for these activities while your back heals.  Increase physical activity slowly as tolerated.  Taking short walks is encouraged, but avoid strenuous exercise. Do not jog, run, bicycle, lift weights, or participate in any other exercises unless specifically allowed by your doctor. Avoid prolonged sitting, including car rides.  Talk to your doctor before resuming sexual activity.  You should not drive until cleared by your doctor.  Until released by your doctor, you should not return to work or school.  You should rest at home and let your body heal.   You may shower three days after your surgery.  After showering, lightly dab your incision dry. Do not take a tub bath or go swimming for 3 weeks, or until approved by your doctor at your follow-up appointment.  If you smoke, we strongly recommend that you quit.  Smoking has been proven to interfere with normal healing in your back and will  dramatically reduce the success rate of your surgery. Please contact [facility] (800-QUIT-NOW) and use the resources at www.[facility].com for assistance in stopping smoking.  Surgical Incision   If you have a dressing on your incision, you may remove it three days after your surgery. Keep your incision area clean and dry.  Your incision was closed with Dermabond glue. The glue should begin to peel away within about a week.  Diet            You may return to your usual diet. Be sure to stay hydrated.  When to Contact Us   Although your surgery and recovery will likely be uneventful, you may have some residual numbness, aches, and pains in your back and/or legs. This is normal and should improve in the next few weeks.  However, should you experience any of the following, contact us  immediately: New numbness or weakness Pain that is progressively getting worse, and is not relieved by your pain medications or rest Bleeding, redness, swelling, pain, or drainage from surgical incision Chills or flu-like symptoms Fever greater than 101.0 F (38.3 C) Problems with bowel or bladder functions Difficulty breathing or shortness of breath Warmth, tenderness, or swelling in your calf  Contact Information How to contact us :  If you have any questions/concerns before or after surgery, you can reach us  at [phone], or you can send a mychart message. We can be reached by phone or mychart 8am-4pm, Monday-Friday.  *Please note: Calls after 4pm are forwarded to a third party answering service. Mychart messages are not routinely monitored during evenings,  weekends, and holidays. Please call our office to contact the answering service for urgent concerns during non-business hours.

## 2024-06-28 NOTE — H&P (Signed)
 "    Referring Physician:  Rudolpho Norleen BIRCH, MD [address] [place],  [place] [address]  Primary Physician:  Rudolpho Norleen BIRCH, MD  History of Present Illness: 06/28/2024  Joe Tyler presents today for surgical intervention.  05/20/2024 Joe Tyler is here today with a chief complaint of back and leg pain.  He has been having worsening symptoms as noted below.  He is being evaluated by PT next week.  The symptoms are causing a significant impact on the patient's life.   I have utilized the care everywhere function in epic to review the outside records available from external health systems.  Progress Note from Joe Tyler, Joe Tyler on 05/04/24:  History of Present Illness: 05/04/2024 Joe Tyler is here today for follow-up on acute on chronic back pain that has become worse over the past 3 months.  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  He previously underwent a lumbar decompression and microdiscectomy in 2018.  Pain continues to be primarily in his low back and buttock and does not radiate down his legs.  He does feel as though his chronic neuropathy in bilateral feet affects his balance and walking.  Denies much heaviness in his feet, but has had some muscle cramping.  He has not participated in formal physical therapy within the past year.    Weakness: none   Bowel/Bladder Dysfunction: none   Conservative measures:  Physical therapy: Has not participated in since 2024. Multimodal medical therapy including regular antiinflammatories: Gabapentin , Robaxin  Injections: None  -s/p right L5-S1 and S1 TFESI 11/20/2023 with no significant relief  -s/p right S1 TFESI 10/13/2023 with 50% relief -s/p bilateral L3, L4 medial branch and dorsal rami RFA 02/20/2022 with 80% relief -s/p bilateral S1 TFESI 11/29/2021 with 1 day of relief   -s/p bilateral SI joint injections 02/14/2023 with 90% improvement -s/p bilateral SI joint injections 06/02/2023  with 50-70% improvement -s/p bilateral SI joint injections 09/11/2023 with 70% improvement  -s/p left C4-5 TFESI 06/29/2021 with minimal relief -s/p right C5-6 TFESI 07/23/2021 with minimal relief    Past Surgery:  06/26/2016-LUMBAR LAMINECTOMY/DECOMPRESSION MICRODISCECTOMY   Review of Systems:  A 10 point review of systems is negative, except for the pertinent positives and negatives detailed in the HPI.  Past Medical History: Past Medical History:  Diagnosis Date   Actinic keratosis    Hx PDT   Anxiety    Panic Attack   Arthritis    Diabetes mellitus without complication (HCC)    no meds   Family history of breast cancer 04/04/2022   Family history of pancreatic cancer 04/04/2022   Gout    History of colonic polyps 04/04/2022   Hyperlipidemia    Motion sickness    ocean boats   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HCC)    PVD (peripheral vascular disease)    Shingles    Spinal stenosis    Wears hearing aid in both ears     Past Surgical History: Past Surgical History:  Procedure Laterality Date   benign tumor removed Right arm   BILATERAL HIP ARTHROSCOPY     CATARACT EXTRACTION W/PHACO Left 10/15/2023   Procedure: PHACOEMULSIFICATION, CATARACT, WITH IOL INSERTION 7.54 00:34.1;  Surgeon: Mittie Gaskin, MD;  Location: [facility];  Service: Ophthalmology;  Laterality: Left;   CATARACT EXTRACTION W/PHACO Right 10/29/2023   Procedure: PHACOEMULSIFICATION, CATARACT, WITH IOL INSERTION 6.00 00:29.5;  Surgeon: Mittie Gaskin, MD;  Location: [facility];  Service: Ophthalmology;  Laterality: Right;  COLONOSCOPY WITH PROPOFOL  N/A 04/07/2015   Procedure: COLONOSCOPY WITH PROPOFOL ;  Surgeon: Lamar ONEIDA Holmes, MD;  Location: [facility] ENDOSCOPY;  Service: Endoscopy;  Laterality: N/A;   COLONOSCOPY WITH PROPOFOL  N/A 05/13/2022   Procedure: COLONOSCOPY WITH PROPOFOL ;  Surgeon: Maryruth Ole ONEIDA, MD;  Location: [facility] ENDOSCOPY;  Service: Endoscopy;  Laterality: N/A;    ESOPHAGOGASTRODUODENOSCOPY (EGD) WITH PROPOFOL  N/A 04/07/2015   Procedure: ESOPHAGOGASTRODUODENOSCOPY (EGD) WITH PROPOFOL ;  Surgeon: Lamar ONEIDA Holmes, MD;  Location: [facility] ENDOSCOPY;  Service: Endoscopy;  Laterality: N/A;   ESOPHAGOGASTRODUODENOSCOPY (EGD) WITH PROPOFOL  N/A 05/13/2022   Procedure: ESOPHAGOGASTRODUODENOSCOPY (EGD) WITH PROPOFOL ;  Surgeon: Maryruth Ole ONEIDA, MD;  Location: [facility] ENDOSCOPY;  Service: Endoscopy;  Laterality: N/A;   JOINT REPLACEMENT     LUMBAR LAMINECTOMY/DECOMPRESSION MICRODISCECTOMY N/A 06/26/2016   Procedure: LUMBAR LAMINECTOMY/DECOMPRESSION MICRODISCECTOMY 2 LEVELS L5-S1;  Surgeon: Joe Daisy, MD;  Location: [facility] ORS;  Service: Neurosurgery;  Laterality: N/A;   TONSILLECTOMY      Allergies: Allergies as of 05/20/2024 - Review Complete 05/20/2024  Allergen Reaction Noted   Levaquin [levofloxacin] Other (See Comments) and Anxiety 04/06/2015    Medications:  Current Outpatient Medications:    allopurinol  (ZYLOPRIM ) 300 MG tablet, Take 300 mg by mouth daily., Disp: , Rfl:    ALPRAZolam  (XANAX ) 0.25 MG tablet, Take by mouth as needed., Disp: , Rfl:    atorvastatin (LIPITOR) 20 MG tablet, Take 20 mg by mouth., Disp: , Rfl:    cholecalciferol  (VITAMIN D3) 25 MCG (1000 UNIT) tablet, Take 1,000 Units by mouth daily., Disp: , Rfl:    cyanocobalamin  (VITAMIN B12) 1000 MCG tablet, Take by mouth., Disp: , Rfl:    fluorouracil  (EFUDEX ) 5 % cream, Apply topically 2 (two) times daily. Apply to the arms, hands, and scalp BID x 10 days., Disp: 30 g, Rfl: 1   gabapentin  (NEURONTIN ) 300 MG capsule, Take 600 mg by mouth at bedtime., Disp: , Rfl:    latanoprost  (XALATAN ) 0.005 % ophthalmic solution, SMARTSIG:In Eye(s), Disp: , Rfl:    magnesium  oxide (MAG-OX) 400 (240 Mg) MG tablet, Take 400 mg by mouth daily., Disp: , Rfl:    Multiple Vitamins-Minerals (ICAPS AREDS 2 PO), Take 2 capsules by mouth daily., Disp: , Rfl:   Social History: Social History   Tobacco Use    Smoking status: Former    Current packs/day: 0.00    Average packs/day: 1.5 packs/day for 5.8 years (8.7 ttl pk-yrs)    Types: Cigarettes    Start date: 1964    Quit date: 04/10/1968    Years since quitting: 56.1   Smokeless tobacco: Never  Vaping Use   Vaping status: Never Used  Substance Use Topics   Alcohol use: Yes    Alcohol/week: 3.0 standard drinks of alcohol    Types: 3 Cans of beer per week    Comment: social   Drug use: No    Family Medical History: Family History  Problem Relation Age of Onset   Pancreatic cancer Mother 22   Stomach cancer Maternal Aunt        d. 18   Cancer Maternal Uncle        x2 two mat uncles (identical twins), dx 35s   Lung cancer Paternal Uncle        d. 30s; smoking hx   Ovarian cancer Maternal Grandmother        dx 55s   Cancer Cousin        unknown type; maternal male cousin; dx after 36   Breast cancer Cousin  pat male cousin; dx before 42    Physical Examination: Vitals:   05/20/24 0956  BP: 136/84    General: Patient is in no apparent distress. Attention to examination is appropriate.  Neck:   Supple.  Full range of motion.  Respiratory: Patient is breathing without any difficulty.  Heart sounds normal no MRG. Chest Clear to Auscultation Bilaterally.   NEUROLOGICAL:     Awake, alert, oriented to person, place, and time.  Speech is clear and fluent.   Cranial Nerves: Pupils equal round and reactive to light.  Facial tone is symmetric.  Facial sensation is symmetric. Shoulder shrug is symmetric. Tongue protrusion is midline.  There is no pronator drift.  Strength: Side Biceps Triceps Deltoid Interossei Grip Wrist Ext. Wrist Flex.  R 5 5 5 5 5 5 5   L 5 5 5 5 5 5 5    Side Iliopsoas Quads Hamstring PF DF EHL  R 5 5 5 5 5 5   L 5 5 5 5 5 5    Reflexes are 1+ and symmetric at the biceps, triceps, brachioradialis, patella and achilles.   Hoffman's is absent.   Bilateral upper and lower extremity sensation  is intact to light touch.    No evidence of dysmetria noted.  Gait is untested     Medical Decision Making  Imaging: MRI L spine 04/21/2024 IMPRESSION: 1. 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 Moderate right and mild left foraminal stenosis, with right foraminal stenosis slightly increased. 2. Mild spinal canal stenosis at L5-S1 with lateral recess narrowing, increased from prior, and possible impingement of the traversing S1 nerve roots. Moderate bilateral foraminal stenosis, increased from prior. 3. Lateral recess narrowing at L2-L3, greater on the right and increased from prior, with possible impingement of the traversing right L3 nerve root. 4. Additional degenerative changes as above.   Electronically signed by: Joe Mania MD 04/24/2024 07:08 PM EST RP Workstation: [id number]  L spine Flex Ext 04/07/2024 IMPRESSION: 1. Mild scoliosis with moderate to advanced lower lumbar facet degenerative changes. 2. Grade 1 anterolisthesis L4 on L5 that augments with flexion.     Electronically Signed   By: Joe Tyler M.D.   On: 04/09/2024 23:51   On my measurements this anterolisthesis changes from 8 to 14 mm.  I have personally reviewed the images and agree with the above interpretation.  Assessment and Plan: Joe Tyler is a pleasant 84 y.o. male with instability at L4/5 causing back pain and sciatica.  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  We will proceed with L4/5 XLIF/PSF with decompression.      Nastashia Gallo K. Clois MD, [facility] Neurosurgery "

## 2024-06-28 NOTE — Op Note (Signed)
 Indications: [name] is a 84 y.o. 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   Preoperative Note:   Risks of surgery discussed include: infection, bleeding, stroke, coma, death, paralysis, CSF leak, nerve/spinal cord injury, numbness, tingling, weakness, complex regional pain syndrome, recurrent stenosis and/or disc herniation, vascular injury, development of instability, neck/back pain, need for further surgery, persistent symptoms, development of deformity, and the risks of anesthesia. The patient understood these risks and agreed to proceed.  NAME OF ANTERIOR PROCEDURE:               1. Anterior lumbar interbody fusion via a left lateral retroperitoneal approach at L4/5 2. Placement of a Lordotic Globus Hedron interbody cage, filled with Demineralized Bone Matrix  NAME OF POSTERIOR PROCEDURE 1. Posterior instrumentation using Nuvasive Reline Instrumentation 2. Posterolateral fusion, L4/5, utilizing demineralized bone matrix 3. Use of Stereotaxis 4. L4/5 decompression   PROCEDURE:  Patient was brought to the operating room, intubated, turned to the lateral position.  All pressure points were checked and double-checked.  The patient was prepped and draped in the standard fashion. Prior to prepping, fluoroscopy was brought in and the patient was positioned with a large bump under the contralateral side between the iliac crest and rib cage,  allowing the area between the iliac crest and the lateral aspect of the rib cage to open and increase the ability to reach inferiorly, to facilitate entry into the disc space.  The incision was marked upon the skin both the location of the disc space as well as the superior most aspect of the iliac crest.  Based on the identification of the disc space an incision was prepared, marked upon the skin and eventually was used for our lateral incision.  The fluoroscopy was turned into a cross table A/P image in order to confirm that the patients spine remained in a perpendicular trajectory to the floor without rotation.  Once confirming that all the pressure points were checked and double-checked and the patient remained in sturdy position strapped down in this slightly jack-knifed lateral position, the patient was prepped and draped in standard fashion.  The skin was injected with local anesthetic, then incised until the abdominal wall fascia was noted.  I bluntly dissected posteriorly until we were able to identify the posterior musculature near petits triangle.  At this point, using primarily blunt dissection with our finger aided with a metzenbaum scissor, were able to enter the retroperitoneal cavity.  The retroperitoneal potential space was opened further until palpating out the psoas muscle, the medial aspect of the iliac crest, the medial aspect of the last rib and continued to define the retroperitoneal space with blunt dissection in order to facilitate safe placement of our dilators.    While protecting by dissecting directly onto a finger in the retroperitoneum, the retroperitoneal space was entered safely from the lateral incision and the initial dilator placed onto the muscle belly of the psoas.  While directly stimulating the dilator and after radiographically confirming our location relative to the disc space, I  placed the dilator through the psoas.  The dilators were stimulated to ensure remaining  safely away from any of the lumbar plexus nerves; the dilators were repositioned until no pathologic stimulation was appreciated.  Once I had confirmed the location of our initial dilator radiographically, a K-wire secured the dilator into the L4/5 disc space and confirmed position under A/P and lateral fluoroscopy.  At this point, I dilated up with direct stimulation to confirm lack of pathologic stimulation.  Once all the dilators were in position, I placed in the retractor and secured it onto the table, locked into position and confirmed under A/P and lateral fluoroscopy to confirm our approach angle to the disc space as well as location relative to the disc space.  I then placed the muscle stimulator in through the working channel down to the vertebral body, stimulating the entire lateral surface of the vertebral body and any of the visualized psoas muscle that was adjacent to the retractor, confirming again the safe passage to the psoas before we began performing the discectomy.  At this point, we began our discectomy at L4/5.  The disc was incised laterally throughout the extent of our exposure. Using a combination of pituitary rongeurs, Kerrison rongeurs, rasps, curettes of various sorts, we were able to begin to clean out the disc space.  Once we had cleaned out the majority of the disc space, we then cut the lateral annulus with a cob, breaking the lateral annual attachments on the contralateral side by subtly working the cob through the annulus while using flouroscopy.  Care was taken not to extend further than required after cutting the annular attachments.  After this had been performed, we prepared the endplates for placement of our graft, sized a graft to the disc space by serially dilating up in trial sizes until we confirmed that our graft would be well positioned, allowing distraction while maintaining good grip.  This was confirmed under A/P and lateral fluoroscopy in order to ensure its  placement as an eventual trial for placement of our final graft.  We irrigated with saline.  Once confirmed placement, the Hedron implant filled with allograft was impacted into position at L4/5.   Through a combination of intradiscal distraction and anterior releasing, we were able to correct the anterior deformity during disc preparation and placement of the graft.  At this point, final radiographs were performed, and we began closure.  The wound was closed using 0 Vicryl interrupted suture in the fascia and 2-0 Vicryl inverted suture were placed in the subcutaneous tissue and dermis. 3-0 monocryl was used for final closure. Dermabond was used to close the skin.    After closing the anterior part in layers, the patient was repositioned into prone position.  All pressure points were checked and double-checked.  The posterior operative site was prepped and draped in standard fashion.  The stereotactic array was placed.  Stereotactic images were acquired using intraoperative CT scanning.  This was registered to the patient.  Using stereotaxis, screw trajectories were planned and incisions made.  The pedicles from L4 to L5 were cannulated bilaterally and K wires used to secure the tracks.  We then utilized a stereotactic screwdriver to place pedicle screws from L4 to L5.  At each level, Nuvasive Reline pedicle screws were placed.  Once the screws were placed, the screw extensions were then linked, a path was formed for the rod and a rod was utilized to connect the screws.  We then compressed, torqued / counter-torqued  and removed the screw assembly. Once performed on each side, the C-arm was brought back and to take confirmatory CT scan showing appropriate placement of all instrumentation and anatomic alignment.    We then performed the decompression via the R Wiltse incision. The metrx tubes were sequentially advanced and confirmed in position. An 18mm by 60mm tube was positioned over the facet. The  brainlab system confirmed localization. The microscope was then sterilely brought into the field and muscle creep was hemostased with a bipolar and resected with a pituitary rongeur.  A Bovie extender was then used to expose the spinous process and lamina.  Careful attention was placed to not violate the facet capsule. A 3 mm matchstick drill bit was then used to make a hemi-laminotomy trough until the ligamentum flavum was exposed.  This was extended to the base of the spinous process.  Once this was complete and the underlying ligamentum flavum was visualized, it was dissected with a curette and resected with Kerrison rongeurs.  Extensive ligamentum hypertrophy was noted, requiring a substantial amount of time and care for removal.  The dura was identified and palpated. The kerrison rongeur was then used to remove the medial facet bilaterally until no compression was noted.  A balltip probe was used to confirm decompression of the right L5 nerve root.  The L side of thecal sac was palpated and some overgrown tissue removed. No CSF leak was noted.  The wound was copiously irrigated. The tube system was then removed under microscopic visualization and hemostasis was obtained with a bipolar.    A drain was placed.  Posterolateral arthrodesis was performed at L4-L5 utilizing demineralized bone matrix.  We irrigated each incision and obtained hemostasis. Each wound was closed using 0 Vicryl interrupted suture in the fascia, 2-0 Vicryl inverted suture were placed in the subcutaneous tissue and dermis. 3-0 monocryl was used for final closure. Dermabond was used to close the skin.    Needle, lap and all counts were correct at the end of the case.     Edsel Goods PA assisted in the entire procedure. An assistant was required for this procedure due to the complexity.  The assistant provided assistance in tissue manipulation and suction, and was required for the successful and safe performance of the  procedure. I performed the critical portions of the procedure.   Reeves Daisy MD Neurosurgery

## 2024-06-28 NOTE — Plan of Care (Signed)
" °  Problem: Nutritional: Goal: Will attain and maintain optimal nutritional status Outcome: Progressing   Problem: Neurological: Goal: Will regain or maintain usual level of consciousness Outcome: Progressing   Problem: Clinical Measurements: Goal: Postoperative complications will be avoided or minimized Outcome: Progressing   Problem: Respiratory: Goal: Will regain and/or maintain adequate ventilation Outcome: Progressing Goal: Respiratory status will improve Outcome: Progressing   Problem: Skin Integrity: Goal: Demonstrates signs of wound healing without infection Outcome: Progressing   Problem: Urinary Elimination: Goal: Will remain free from infection Outcome: Progressing Goal: Ability to achieve and maintain adequate urine output Outcome: Progressing   Problem: Activity: Goal: Ability to avoid complications of mobility impairment will improve Outcome: Progressing Goal: Will remain free from falls Outcome: Progressing   "

## 2024-06-28 NOTE — Transfer of Care (Signed)
 Immediate Anesthesia Transfer of Care Note  Patient: Joe Tyler  Procedure(s) Performed: ANTERIOR LATERAL LUMBAR FUSION WITH PERCUTANEOUS SCREW 1 LEVEL (Back) APPLICATION OF INTRAOPERATIVE CT SCAN (Back)  Patient Location: PACU  Anesthesia Type:General  Level of Consciousness: awake and drowsy  Airway & Oxygen Therapy: Patient Spontanous Breathing and Patient connected to face mask oxygen  Post-op Assessment: Report given to RN and Post -op Vital signs reviewed and stable  Post vital signs: Reviewed and stable  Last Vitals:  Vitals Value Taken Time  BP 111/65 06/28/24 13:22  Temp    Pulse 69 06/28/24 13:29  Resp 23 06/28/24 13:29  SpO2 98 % 06/28/24 13:29  Vitals shown include unfiled device data.  Last Pain:  Vitals:   06/28/24 0815  TempSrc: Temporal  PainSc: 0-No pain         Complications: No notable events documented.

## 2024-06-28 NOTE — Progress Notes (Signed)
 Discharge medications delivered. Medications secured pending discharge.   Hunter JONETTA Hope, RN

## 2024-06-28 NOTE — Anesthesia Procedure Notes (Signed)
 Procedure Name: Intubation Date/Time: 06/28/2024 10:15 AM  Performed by: Lorriane Arabia, CRNAPre-anesthesia Checklist: Patient identified, Emergency Drugs available, Suction available and Patient being monitored Patient Re-evaluated:Patient Re-evaluated prior to induction Oxygen Delivery Method: Circle system utilized Preoxygenation: Pre-oxygenation with 100% oxygen Induction Type: IV induction Ventilation: Mask ventilation without difficulty Laryngoscope Size: Mac and 4 Grade View: Grade II Tube type: Oral Tube size: 7.5 mm Number of attempts: 1 Airway Equipment and Method: Stylet and Oral airway Placement Confirmation: ETT inserted through vocal cords under direct vision, positive ETCO2 and breath sounds checked- equal and bilateral Secured at: 24 cm Tube secured with: Tape Dental Injury: Teeth and Oropharynx as per pre-operative assessment

## 2024-06-29 ENCOUNTER — Encounter: Payer: Self-pay | Admitting: Neurosurgery

## 2024-06-29 DIAGNOSIS — Q8589 Other phakomatoses, not elsewhere classified: Secondary | ICD-10-CM | POA: Diagnosis not present

## 2024-06-29 DIAGNOSIS — Z974 Presence of external hearing-aid: Secondary | ICD-10-CM | POA: Diagnosis not present

## 2024-06-29 DIAGNOSIS — Z87891 Personal history of nicotine dependence: Secondary | ICD-10-CM | POA: Diagnosis not present

## 2024-06-29 DIAGNOSIS — Z881 Allergy status to other antibiotic agents status: Secondary | ICD-10-CM | POA: Diagnosis not present

## 2024-06-29 DIAGNOSIS — M5442 Lumbago with sciatica, left side: Secondary | ICD-10-CM | POA: Diagnosis present

## 2024-06-29 DIAGNOSIS — Z860100 Personal history of colon polyps, unspecified: Secondary | ICD-10-CM | POA: Diagnosis not present

## 2024-06-29 DIAGNOSIS — Z8619 Personal history of other infectious and parasitic diseases: Secondary | ICD-10-CM | POA: Diagnosis not present

## 2024-06-29 DIAGNOSIS — M532X6 Spinal instabilities, lumbar region: Secondary | ICD-10-CM | POA: Diagnosis present

## 2024-06-29 DIAGNOSIS — E785 Hyperlipidemia, unspecified: Secondary | ICD-10-CM | POA: Diagnosis present

## 2024-06-29 DIAGNOSIS — M5441 Lumbago with sciatica, right side: Secondary | ICD-10-CM | POA: Diagnosis present

## 2024-06-29 DIAGNOSIS — E1140 Type 2 diabetes mellitus with diabetic neuropathy, unspecified: Secondary | ICD-10-CM | POA: Diagnosis present

## 2024-06-29 DIAGNOSIS — M545 Low back pain, unspecified: Secondary | ICD-10-CM | POA: Diagnosis present

## 2024-06-29 DIAGNOSIS — Z79899 Other long term (current) drug therapy: Secondary | ICD-10-CM | POA: Diagnosis not present

## 2024-06-29 DIAGNOSIS — M48061 Spinal stenosis, lumbar region without neurogenic claudication: Secondary | ICD-10-CM | POA: Diagnosis present

## 2024-06-29 DIAGNOSIS — G8929 Other chronic pain: Secondary | ICD-10-CM | POA: Diagnosis present

## 2024-06-29 DIAGNOSIS — M1990 Unspecified osteoarthritis, unspecified site: Secondary | ICD-10-CM | POA: Diagnosis present

## 2024-06-29 DIAGNOSIS — E1151 Type 2 diabetes mellitus with diabetic peripheral angiopathy without gangrene: Secondary | ICD-10-CM | POA: Diagnosis present

## 2024-06-29 DIAGNOSIS — L570 Actinic keratosis: Secondary | ICD-10-CM | POA: Diagnosis present

## 2024-06-29 DIAGNOSIS — M4316 Spondylolisthesis, lumbar region: Secondary | ICD-10-CM | POA: Diagnosis present

## 2024-06-29 NOTE — TOC Initial Note (Signed)
 Transition of Care [facility]) - Initial/Assessment Note    Patient Details  Name: Joe Tyler MRN: [id number] Date of Birth: 03-13-1941  Transition of Care [facility]) CM/SW Contact:    Lauraine JAYSON Carpen, LCSW Phone Number: 06/29/2024, 3:31 PM  Clinical Narrative:   CSW met with patient. Wife and daughter-in-law at bedside. CSW introduced role and explained that therapy recommendations would be discussed. Patient is agreeable to home health and prefers [facility] because his wife worked with them in the past and she had a good experience. [facility] has accepted referral for PT and OT. Patient agreeable to RW. Ordered through [employer]. No further concerns. CSW will continue to follow patient and his family for support and facilitate return home once stable. Son will transport him home at discharge.              Expected Discharge Plan: Home w Home Health Services Barriers to Discharge: Continued Medical Work up   Patient Goals and CMS Choice   CMS Medicare.gov Compare Post Acute Care list provided to:: Patient Choice offered to / list presented to : Patient      Expected Discharge Plan and Services     Post Acute Care Choice: Durable Medical Equipment, Home Health Living arrangements for the past 2 months: Single Family Home                 DME Arranged: Walker rolling DME Agency: [employer] Date DME Agency Contacted: 06/29/24   Representative spoke with at DME Agency: [place] HH Arranged: PT, OT HH Agency: [facility] Date [facility] Agency Contacted: 06/29/24   Representative spoke with at [facility] Agency: [place]  Prior Living Arrangements/Services Living arrangements for the past 2 months: Single Family Home Lives with:: Spouse Patient language and need for interpreter reviewed:: Yes Do you feel safe going back to the place where you live?: Yes      Need for Family Participation in Patient Care: Yes (Comment) Care giver support system in place?: Yes (comment)   Criminal  Activity/Legal Involvement Pertinent to Current Situation/Hospitalization: No - Comment as needed  Activities of Daily Living   ADL Screening (condition at time of admission) Independently performs ADLs?: Yes (appropriate for developmental age) Is the patient deaf or have difficulty hearing?: No Does the patient have difficulty seeing, even when wearing glasses/contacts?: No Does the patient have difficulty concentrating, remembering, or making decisions?: No  Permission Sought/Granted Permission sought to share information with : Facility Medical Sales Representative, Family Supports Permission granted to share information with : Yes, Verbal Permission Granted  Share Information with NAME: Jekhi Bolin  Permission granted to share info w AGENCY: [facility]  Permission granted to share info w Relationship: Wife  Permission granted to share info w Contact Information: [phone]  Emotional Assessment Appearance:: Appears stated age Attitude/Demeanor/Rapport: Engaged, Gracious Affect (typically observed): Accepting, Appropriate, Calm, Pleasant Orientation: : Oriented to Self, Oriented to Place, Oriented to  Time, Oriented to Situation Alcohol / Substance Use: Not Applicable Psych Involvement: No (comment)  Admission diagnosis:  Spondylolisthesis of lumbar region [M43.16] Chronic bilateral low back pain with bilateral sciatica [M54.42, M54.41, G89.29] Spinal instability, lumbar [M53.2X6] S/P lumbar fusion [Z98.1] Patient Active Problem List   Diagnosis Date Noted   Spondylolisthesis of lumbar region 06/28/2024   Chronic bilateral low back pain with bilateral sciatica 06/28/2024   Spinal instability of lumbar region 06/28/2024   S/P lumbar fusion 06/28/2024   Right arm pain 07/10/2023   Muscle tear 07/10/2023   Genetic testing 04/30/2022  History of colonic polyps 04/04/2022   Family history of breast cancer 04/04/2022   Family history of pancreatic cancer 04/04/2022    Pain in limb 02/13/2021   Pain of right shoulder joint on movement 12/17/2018   Arm numbness 08/18/2018   Bilateral hand pain 08/18/2018   Neck pain 08/18/2018   Numbness and tingling 06/30/2018   Benign prostatic hyperplasia with urinary frequency 03/06/2018   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   Abnormal EKG 06/06/2016   Chronic hip pain, left 03/04/2016   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 06/08/2015   HLD (hyperlipidemia) 05/04/2015   Arthritis, degenerative 05/04/2015   History of Peutz-Jeghers polyp 05/04/2015   Spinal stenosis 05/04/2015   Chronic gouty arthritis 03/03/2015   PCP:  Rudolpho Norleen BIRCH, MD Pharmacy:   [facility] [phone] [facility], [place] - [address] AT [facility] [address] [place] [address] Phone: [phone] Fax: [phone]  [facility] [address] [place] [address] Phone: [phone] Fax: [phone]     Social Drivers of Health (SDOH) Social History: SDOH Screenings   Food Insecurity: No Food Insecurity (06/28/2024)  Housing: High Risk (06/28/2024)  Transportation Needs: No Transportation Needs (06/28/2024)  Utilities: Not At Risk (06/28/2024)  Financial Resource Strain: Low Risk  (05/28/2024)   Received from [facility]  Social Connections: Socially Integrated (06/28/2024)  Tobacco Use: Medium Risk (06/28/2024)   SDOH Interventions:     Readmission Risk Interventions     No data to display

## 2024-06-29 NOTE — Plan of Care (Signed)
" °  Problem: Urinary Elimination: Goal: Will remain free from infection Outcome: Progressing   "

## 2024-06-29 NOTE — Anesthesia Postprocedure Evaluation (Signed)
"   Anesthesia Post Note  Patient: Joe Tyler  Procedure(s) Performed: ANTERIOR LATERAL LUMBAR FUSION WITH PERCUTANEOUS SCREW 1 LEVEL (Back) APPLICATION OF INTRAOPERATIVE CT SCAN (Back)  Patient location during evaluation: PACU Anesthesia Type: General Level of consciousness: awake and alert Pain management: pain level controlled Vital Signs Assessment: post-procedure vital signs reviewed and stable Respiratory status: spontaneous breathing, nonlabored ventilation and respiratory function stable Cardiovascular status: blood pressure returned to baseline and stable Postop Assessment: no apparent nausea or vomiting Anesthetic complications: no   No notable events documented.   Last Vitals:  Vitals:   06/28/24 2352 06/29/24 0503  BP: 102/76 (!) 117/57  Pulse: 96 (!) 104  Resp: 18 18  Temp: 36.6 C   SpO2: 98% 95%    Last Pain:  Vitals:   06/29/24 0502  TempSrc:   PainSc: 1                  [name]      "

## 2024-06-29 NOTE — Progress Notes (Signed)
" ° °  Neurosurgery Progress Note  History: Joe Tyler is here for left L4-5 XLIF, decompression, and PSF  POD2: patient states his pain is much better today and he is only experiencing pain when he is getting up with PT. He said he feels more ready to go home today than he did on POD1. POD1: Pt doing well this morning despite not getting much rest overnight. He admits to some cramping in his right foot that improved with a packet of mustard. He feels his pain is largely well controlled. He has urinated this morning since his catheter was removed.   Physical Exam: Vitals:   06/29/24 0829 06/29/24 1542  BP: 123/71 131/62  Pulse: 99 95  Resp: 17 17  Temp: 98.1 F (36.7 C) 98 F (36.7 C)  SpO2: 97% 96%    AA Ox3 CNI  Strength:  Side Iliopsoas Quads Hamstring PF DF EHL  R 5 5 5 5 5 5   L 5 5 5 5 5 5     Incision: all incisions clean and dry  Assessment/Plan:  Joe Tyler is an 84 year old male POD2 left L4-5 XLIF, decompression, and PSF who presented with worsening lower back pain over the last 3 months after a previous lumbar decompression and microdiscectomy.  - mobilize - pain control - DVT prophylaxis - PTOT  Edsel Goods PA-C (in conjunction with Tobey Libel, PA-S) Department of Neurosurgery    "

## 2024-06-29 NOTE — Progress Notes (Signed)
" ° °  Neurosurgery Progress Note  History: Roe Wilner is s/p left L4-5 XLIF, decompression and PSF  POD1: Pt doing well this morning despite not getting much rest overnight. He admits to some cramping in his right foot that improved with a packet of mustard. He feels his pain is largely well controlled. He has urinated this morning since his catheter was removed.   Physical Exam: Vitals:   06/28/24 2352 06/29/24 0503  BP: 102/76 (!) 117/57  Pulse: 96 (!) 104  Resp: 18 18  Temp: 97.9 F (36.6 C)   SpO2: 98% 95%    AA Ox3 CNI  Strength:5/5 throughout BLE  HV 55 since surgery   Incisions: c/s/I with dressings in place   Data:  Other tests/results:  See results review  Assessment/Plan:  Wilburt Messina is an 84 y.o presenting with spondylolisthesis and bilateral sciatica s/p L4-5 decompression and fusion  - mobilize - will continue to monitor HV output and likely remove this afternoon if output remains low - pain control - DVT prophylaxis - PTOT; dispo planning underway  Edsel Goods PA-C Department of Neurosurgery    "

## 2024-06-29 NOTE — Care Management Obs Status (Signed)
 MEDICARE OBSERVATION STATUS NOTIFICATION   Patient Details  Name: Joe Tyler MRN: [id number] Date of Birth: 1940/11/08   Medicare Observation Status Notification Given:  Chaney BRANDY CHRISTIANE LELON, CMA 06/29/2024, 2:10 PM

## 2024-06-29 NOTE — Evaluation (Signed)
 Physical Therapy Evaluation Patient Details Name: Aakash Hollomon MRN: [id number] DOB: 29-Sep-1940 Today's Date: 06/29/2024  History of Present Illness  Pt is an 84 y/o M admitted on 06/28/24 for scheduled L L4-5 XLIF, decompression & PSF. PMH: anxiety, DM, HLD, Peutz-Jaghers syndrome, PVD, shingles, spinal stenosis, gout  Clinical Impression  Pt seen for PT evaluation with pt agreeable to tx. Reviewed back precautions with pt already having a good understanding of this. Pt reports prior to admission he was independent without AD, driving, caring for his wife. On this date, pt is able to complete log rolling with supervision, bed flat, bed rails. Pt ambulates with RW & CGA fade to supervision, reporting he feels better as he continues to mobilize. Will continue to follow pt acutely to progress mobility as able.        If plan is discharge home, recommend the following: A little help with walking and/or transfers;A little help with bathing/dressing/bathroom;Assistance with cooking/housework;Help with stairs or ramp for entrance;Assist for transportation   Can travel by private vehicle        Equipment Recommendations Rolling walker (2 wheels)  Recommendations for Other Services       Functional Status Assessment Patient has had a recent decline in their functional status and demonstrates the ability to make significant improvements in function in a reasonable and predictable amount of time.     Precautions / Restrictions Precautions Precautions: Fall;Back Restrictions Weight Bearing Restrictions Per Provider Order: No      Mobility  Bed Mobility Overal bed mobility: Needs Assistance Bed Mobility: Rolling, Sidelying to Sit Rolling: Supervision, Used rails Sidelying to sit: Supervision, Used rails       General bed mobility comments: able to perform log rolling without cuing, use of bed rail, bed flat    Transfers Overall transfer level: Needs assistance Equipment used:  Rolling walker (2 wheels) Transfers: Sit to/from Stand Sit to Stand: Contact guard assist           General transfer comment: cuing re: hand placement, slowly lower to sitting    Ambulation/Gait Ambulation/Gait assistance: Contact guard assist, Supervision Gait Distance (Feet):  (>150 ft) Assistive device: Rolling walker (2 wheels) Gait Pattern/deviations: Decreased step length - right, Decreased step length - left, Decreased stride length Gait velocity: decreased     General Gait Details: CGA fade to supervision  Stairs            Wheelchair Mobility     Tilt Bed    Modified Rankin (Stroke Patients Only)       Balance Overall balance assessment: Needs assistance Sitting-balance support: Feet supported Sitting balance-Leahy Scale: Good     Standing balance support: During functional activity, Bilateral upper extremity supported, Reliant on assistive device for balance Standing balance-Leahy Scale: Fair                               Pertinent Vitals/Pain Pain Assessment Pain Assessment: 0-10 Pain Score: 2  Pain Location: back Pain Descriptors / Indicators: Sore Pain Intervention(s): Monitored during session    Home Living Family/patient expects to be discharged to:: Private residence Living Arrangements: Spouse/significant other Available Help at Discharge: Family;Friend(s);Available PRN/intermittently Type of Home: House Home Access: Level entry         Home Equipment: Cane - single point;Toilet riser      Prior Function Prior Level of Function : Independent/Modified Independent;Driving  Mobility Comments: independent without AD, denies falls, cares for his wife ADLs Comments: independent, does ADLs & IADLs     Extremity/Trunk Assessment   Upper Extremity Assessment Upper Extremity Assessment: Overall WFL for tasks assessed    Lower Extremity Assessment Lower Extremity Assessment: Generalized  weakness;Overall WFL for tasks assessed (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    Exercises     Assessment/Plan    PT Assessment Patient needs continued PT services  PT Problem List Decreased strength;Cardiopulmonary status limiting activity;Pain;Decreased range of motion;Decreased activity tolerance;Decreased balance;Decreased mobility;Decreased knowledge of precautions;Decreased knowledge of use of DME       PT Treatment Interventions DME instruction;Therapeutic exercise;Gait training;Balance training;Stair training;Neuromuscular re-education;Functional mobility training;Therapeutic activities;Patient/family education    PT Goals (Current goals can be found in the Care Plan section)  Acute Rehab PT Goals Patient Stated Goal: go home when he's safe to do so PT Goal Formulation: With patient Time For Goal Achievement: 07/13/24 Potential to Achieve Goals: Good    Frequency 7X/week     Co-evaluation               AM-PAC PT 6 Clicks Mobility  Outcome Measure Help needed turning from your back to your side while in a flat bed without using bedrails?: None Help needed moving from lying on your back to sitting on the side of a flat bed without using bedrails?: A Little Help needed moving to and from a bed to a chair (including a wheelchair)?: A Little Help needed standing up from a chair using your arms (e.g., wheelchair or bedside chair)?: A Little Help  needed to walk in hospital room?: A Little Help needed climbing 3-5 steps with a railing? : A Little 6 Click Score: 19    End of Session   Activity Tolerance: Patient tolerated treatment well Patient left: in chair;with call bell/phone within reach Nurse Communication: Mobility status PT Visit Diagnosis: Unsteadiness on feet (R26.81);Muscle weakness (generalized) (M62.81);Other abnormalities of gait and mobility (R26.89)    Time: 9084-9063 PT Time Calculation (min) (ACUTE ONLY): 21 min   Charges:   PT Evaluation $PT Eval Low Complexity: 1 Low   PT General Charges $$ ACUTE PT VISIT: 1 Visit         Richerd Pinal, PT, DPT 06/29/2024, 10:01 AM   Richerd CHRISTELLA Pinal 06/29/2024, 10:00 AM

## 2024-06-29 NOTE — Progress Notes (Signed)
 Patient ambulated around unit with one assist, did well.  To BR, patient states he had a small bowel movement.

## 2024-06-29 NOTE — Evaluation (Signed)
 Occupational Therapy Evaluation Patient Details Name: Joe Tyler MRN: [id number] DOB: 01-04-1941 Today's Date: 06/29/2024   History of Present Illness   Pt is an 84 y/o M admitted on 06/28/24 for scheduled L L4-5 XLIF, decompression & PSF. PMH: anxiety, DM, HLD, Peutz-Jaghers syndrome, PVD, shingles, spinal stenosis, gout     Clinical Impressions Patient presenting with decreased Ind in self care,balance, functional mobility, transfers, endurance, and safety awareness. Patient reports being Ind at baseline and living at home with wife. Pt accurately reports back precautions and OT further educates pt on there use related to self care. Pt demonstrates figure four position for LB dressing and ambulates with RW with supervision to bathroom to stand at sink for hygiene to brush teeth. Pt then sits on commode for toileting needs. Patient making great progress.  Patient will benefit from acute OT to increase overall independence in the areas of ADLs, functional mobility, and safety awareness in order to safely discharge.      If plan is discharge home, recommend the following:   A little help with walking and/or transfers;A little help with bathing/dressing/bathroom;Assist for transportation;Help with stairs or ramp for entrance     Functional Status Assessment   Patient has had a recent decline in their functional status and demonstrates the ability to make significant improvements in function in a reasonable and predictable amount of time.     Equipment Recommendations   Other (comment) (2WW)      Precautions/Restrictions   Precautions Precautions: Fall;Back Restrictions Weight Bearing Restrictions Per Provider Order: No     Mobility Bed Mobility               General bed mobility comments: seated in recliner chair    Transfers Overall transfer level: Needs assistance Equipment used: Rolling walker (2 wheels) Transfers: Sit to/from Stand Sit to Stand:  Contact guard assist                  Balance Overall balance assessment: Needs assistance Sitting-balance support: Feet supported Sitting balance-Leahy Scale: Good     Standing balance support: During functional activity, Bilateral upper extremity supported, Reliant on assistive device for balance Standing balance-Leahy Scale: Fair                             ADL either performed or assessed with clinical judgement   ADL Overall ADL's : Needs assistance/impaired     Grooming: Wash/dry hands;Oral care;Standing;Supervision/safety               Lower Body Dressing: Supervision/safety;Sit to/from stand Lower Body Dressing Details (indicate cue type and reason): demonstrated figure four position                     Vision Patient Visual Report: No change from baseline              Pertinent Vitals/Pain Pain Assessment Pain Assessment: 0-10 Pain Score: 2  Pain Location: back Pain Descriptors / Indicators: Sore Pain Intervention(s): Monitored during session     Extremity/Trunk Assessment Upper Extremity Assessment Upper Extremity Assessment: Overall WFL for tasks assessed   Lower Extremity Assessment Lower Extremity Assessment: Overall WFL for tasks assessed   Cervical / Trunk Assessment Cervical / Trunk Assessment: Back Surgery   Communication Communication Communication: No apparent difficulties   Cognition Arousal: Alert Behavior During Therapy: WFL for tasks assessed/performed Cognition: No apparent impairments  Following commands: Intact       Cueing  General Comments   Cueing Techniques: Verbal cues  Reviewed back precautions & activity modifications with pt.           Home Living Family/patient expects to be discharged to:: Private residence Living Arrangements: Spouse/significant other Available Help at Discharge: Family;Friend(s);Available PRN/intermittently Type of  Home: House Home Access: Level entry     Home Layout: One level     Bathroom Shower/Tub: Producer, Television/film/video: Handicapped height Bathroom Accessibility: Yes   Home Equipment: Cane - single point;Toilet riser          Prior Functioning/Environment Prior Level of Function : Independent/Modified Independent;Driving             Mobility Comments: independent without AD, denies falls, cares for his wife ADLs Comments: Ind    OT Problem List: Decreased strength;Impaired balance (sitting and/or standing);Decreased safety awareness;Pain;Decreased activity tolerance   OT Treatment/Interventions: Balance training;Energy conservation;Therapeutic activities;Self-care/ADL training;Patient/family education      OT Goals(Current goals can be found in the care plan section)   Acute Rehab OT Goals Patient Stated Goal: to go home OT Goal Formulation: With patient Time For Goal Achievement: 07/13/24 Potential to Achieve Goals: Fair ADL Goals Pt Will Perform Grooming: with modified independence;standing Pt Will Perform Lower Body Dressing: with modified independence;sit to/from stand Pt Will Transfer to Toilet: with modified independence;ambulating Pt Will Perform Toileting - Clothing Manipulation and hygiene: with modified independence;sit to/from stand   OT Frequency:  Min 2X/week       AM-PAC OT 6 Clicks Daily Activity     Outcome Measure Help from another person eating meals?: None Help from another person taking care of personal grooming?: None Help from another person toileting, which includes using toliet, bedpan, or urinal?: A Little Help from another person bathing (including washing, rinsing, drying)?: A Little Help from another person to put on and taking off regular upper body clothing?: None Help from another person to put on and taking off regular lower body clothing?: A Little 6 Click Score: 21   End of Session Equipment Utilized During  Treatment: Rolling walker (2 wheels) Nurse Communication: Mobility status  Activity Tolerance: Patient tolerated treatment well Patient left: in bed;with call bell/phone within reach  OT Visit Diagnosis: Unsteadiness on feet (R26.81);Muscle weakness (generalized) (M62.81);Repeated falls (R29.6)                Time: 8962-8898 OT Time Calculation (min): 24 min Charges:  OT General Charges $OT Visit: 1 Visit OT Evaluation $OT Eval Moderate Complexity: 1 Mod OT Treatments $Self Care/Home Management : 8-22 mins  Izetta Claude, MS, OTR/L , CBIS ascom [phone]  06/29/2024, 1:22 PM

## 2024-06-30 ENCOUNTER — Other Ambulatory Visit: Payer: Self-pay

## 2024-06-30 LAB — HEPATITIS B SURFACE ANTIBODY, QUANTITATIVE: Hep B S AB Quant (Post): <3.5 m[IU]/mL — ABNORMAL LOW

## 2024-06-30 MED ORDER — METHOCARBAMOL 500 MG PO TABS
500.0000 mg | ORAL_TABLET | Freq: Four times a day (QID) | ORAL | 0 refills | Status: AC | PRN
  Filled 2024-06-30: qty 120, 30d supply, fill #0

## 2024-06-30 MED ORDER — OXYCODONE HCL 5 MG PO TABS
5.0000 mg | ORAL_TABLET | ORAL | 0 refills | Status: DC | PRN
  Filled 2024-06-30: qty 30, 5d supply, fill #0

## 2024-06-30 MED ORDER — SENNA 8.6 MG PO TABS
1.0000 | ORAL_TABLET | Freq: Two times a day (BID) | ORAL | 0 refills | Status: AC | PRN
  Filled 2024-06-30: qty 30, 15d supply, fill #0

## 2024-06-30 MED ORDER — POLYETHYLENE GLYCOL 3350 17 GM/SCOOP PO POWD
17.0000 g | Freq: Every day | ORAL | 0 refills | Status: AC | PRN
  Filled 2024-06-30: qty 238, 14d supply, fill #0

## 2024-06-30 NOTE — Progress Notes (Signed)
 Physical Therapy Treatment Patient Details Name: Joe Tyler MRN: [id number] DOB: [birth year] Today's Date: 06/30/2024   History of Present Illness Pt is an 84 y/o M admitted on 06/28/24 for scheduled L L4-5 XLIF, decompression & PSF. PMH: anxiety, DM, HLD, Peutz-Jaghers syndrome, PVD, shingles, spinal stenosis, gout    PT Comments  Pt was supine in bed with HOB elevated ~ 30 degrees upon arrival. He is A and O x 4. Endorses feeling better today than previous date. He demonstrated safe abilities to exit bed, stand to RW, and tolerate ambulation > 300 ft. Pt is progressing well overall and is cleared from an acute PT standpoint for safe DC home.     If plan is discharge home, recommend the following: A little help with walking and/or transfers;A little help with bathing/dressing/bathroom;Assistance with cooking/housework;Help with stairs or ramp for entrance;Assist for transportation     Equipment Recommendations  Rolling walker (2 wheels)       Precautions / Restrictions Precautions Precautions: Fall;Back Restrictions Weight Bearing Restrictions Per Provider Order: No     Mobility  Bed Mobility Overal bed mobility: Modified Independent Bed Mobility: Rolling, Sidelying to Sit, Supine to Sit Rolling: Supervision, Used rails Sidelying to sit: Supervision Supine to sit: Supervision     Transfers Overall transfer level: Modified independent Equipment used: Rolling walker (2 wheels) Transfers: Sit to/from Stand Sit to Stand: Supervision       Ambulation/Gait Ambulation/Gait assistance: Supervision Gait Distance (Feet): 300 Feet Assistive device: Rolling walker (2 wheels) Gait Pattern/deviations: Step-through pattern Gait velocity: WNL  General Gait Details: Pt demonstrated safe steady gait without LOB or safety concerns with use of RW    Balance Overall balance assessment: Needs assistance Sitting-balance support: Feet supported Sitting balance-Leahy Scale: Good      Standing balance support: Bilateral upper extremity supported, During functional activity, Reliant on assistive device for balance Standing balance-Leahy Scale: Good         Communication Communication Communication: No apparent difficulties  Cognition Arousal: Alert Behavior During Therapy: WFL for tasks assessed/performed   PT - Cognitive impairments: No apparent impairments    PT - Cognition Comments: Pt is A and O x 4 Following commands: Intact      Cueing Cueing Techniques: Verbal cues, Tactile cues     General Comments General comments (skin integrity, edema, etc.): reviewed post acute needs and expectations. recommend continued post acute PT      Pertinent Vitals/Pain Pain Assessment Pain Assessment: 0-10 Pain Score: 2  Pain Location: back Pain Descriptors / Indicators: Sore Pain Intervention(s): Limited activity within patient's tolerance, Monitored during session, Premedicated before session, Repositioned     PT Goals (current goals can now be found in the care plan section) Acute Rehab PT Goals Patient Stated Goal: go home today Progress towards PT goals: Progressing toward goals    Frequency    7X/week       AM-PAC PT 6 Clicks Mobility   Outcome Measure  Help needed turning from your back to your side while in a flat bed without using bedrails?: None Help needed moving from lying on your back to sitting on the side of a flat bed without using bedrails?: A Little Help needed moving to and from a bed to a chair (including a wheelchair)?: A Little Help needed standing up from a chair using your arms (e.g., wheelchair or bedside chair)?: A Little Help needed to walk in hospital room?: A Little Help needed climbing 3-5 steps with a railing? : A Little  6 Click Score: 19    End of Session   Activity Tolerance: Patient tolerated treatment well Patient left: in chair;with call bell/phone within reach Nurse Communication: Mobility status PT Visit  Diagnosis: Unsteadiness on feet (R26.81);Muscle weakness (generalized) (M62.81);Other abnormalities of gait and mobility (R26.89)     Time: 9095-9071 PT Time Calculation (min) (ACUTE ONLY): 24 min  Charges:    $Gait Training: 8-22 mins $Therapeutic Activity: 8-22 mins PT General Charges $$ ACUTE PT VISIT: 1 Visit                    Rankin Essex PTA 06/30/2024, 10:36 AM

## 2024-06-30 NOTE — Plan of Care (Signed)
" °  Problem: Education: Goal: Knowledge of the prescribed therapeutic regimen will improve Outcome: Progressing   Problem: Bowel/Gastric: Goal: Gastrointestinal status for postoperative course will improve Outcome: Progressing   Problem: Cardiac: Goal: Ability to maintain an adequate cardiac output Outcome: Progressing   Problem: Neurological: Goal: Will regain or maintain usual level of consciousness Outcome: Progressing   Problem: Clinical Measurements: Goal: Ability to maintain clinical measurements within normal limits Outcome: Progressing   "

## 2024-06-30 NOTE — Progress Notes (Signed)
 DISCHARGE NOTE:   Pt dc with IV removed and dc instructions given. Pt received RW and medications delivered to hospital room. Pt voices no questions or concerns at this time. Pt wheeled down to medical mall entrance by staff. Pt's stepson provided transportation.

## 2024-06-30 NOTE — Discharge Summary (Signed)
 Discharge Summary  Patient ID: Joe Tyler MRN: [id number] DOB/AGE: [birth year] 84 y.o.  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  His intraoperative course was uncomplicated.  He was admitted for pain control, drain output monitoring, and therapy evaluation.  His drain output was minimal and removed on the afternoon of postop day 1.  His pain was well-controlled with oral medications.  He was seen and evaluated by therapy and deemed appropriate for discharge home with home health and DME. He was discharged home on POD2 with prescriptions for pain control and stool softeners. The patient was encouraged to follow up with his PCP regarding Hep B titer  Consults: None  Significant Diagnostic Studies:  Hep B A AB quant <3.5.   Treatments: surgery: As above.  Please see separately dictated operative report for further details.  Discharge Exam: Blood pressure (!) 112/59, pulse 91, temperature 98.2 F (36.8 C), temperature source Oral, resp. rate 17, height 5' 10 (1.778 m), weight 91 kg, SpO2 97%.   AA Ox3 CNI   Strength:5/5 throughout BLE   HV 55 since surgery    Incisions: c/s/I with dressings in place     Disposition: Discharge disposition: 06-Home-Health Care Svc       Discharge Instructions     Incentive spirometry RT   Complete by: As directed    Remove  dressing in 24 hours   Complete by: As directed       Allergies as of 06/30/2024       Reactions   Levaquin [levofloxacin] Other (See Comments), Anxiety   Possible panic disorder Panic Disorder        Medication List     STOP taking these medications    fluorouracil  5 % cream Commonly known as: EFUDEX        TAKE these medications    allopurinol  300 MG tablet Commonly known as: ZYLOPRIM  Take 300 mg by mouth daily.   ALPRAZolam  0.25 MG tablet Commonly known as: XANAX  Take 0.125 mg by mouth daily as needed for anxiety.   aspirin EC 81 MG tablet Take 81 mg by mouth daily. Swallow whole.   cholecalciferol  25 MCG (1000 UNIT) tablet Commonly known as: VITAMIN D3 Take 1,000 Units by mouth daily.   cyanocobalamin  1000 MCG tablet Commonly known as: VITAMIN B12 Take 1,000 mcg by mouth daily.   gabapentin  300 MG capsule Commonly known as: NEURONTIN  Take 600 mg by mouth at bedtime.   ICAPS AREDS 2 PO Take 2 capsules by mouth daily.   latanoprost  0.005 % ophthalmic solution Commonly known as: XALATAN  Place 1 drop into both eyes at bedtime.   magnesium  oxide 400 (240 Mg) MG tablet Commonly known as: MAG-OX Take 400 mg by mouth daily.   methocarbamol  500 MG tablet Commonly known as: ROBAXIN  Take 1 tablet (500 mg total) by mouth every 6 (six) hours as needed for muscle spasms.   oxyCODONE  5 MG immediate release tablet Commonly known as: Oxy IR/ROXICODONE  Take 1 tablet (5 mg  total) by mouth every 4 (four) hours as needed for moderate pain (pain score 4-6).   polyethylene glycol 17 g packet Commonly known as: MIRALAX  / GLYCOLAX  Take 17 g by mouth daily as needed for moderate constipation.   senna 8.6 MG Tabs tablet Commonly known as: SENOKOT Take 1 tablet (8.6 mg total) by mouth 2 (two) times daily as needed for mild constipation.               Durable Medical Equipment  (From admission, onward)           Start     Ordered   06/29/24 0959  For  home use only DME Walker rolling  Once       Question Answer Comment  Walker: With 5 Inch Wheels   Patient needs a walker to treat with the following condition S/P lumbar fusion      06/29/24 0958             Signed: Edsel Jama Goods 06/30/2024, 10:25 AM

## 2024-07-08 ENCOUNTER — Ambulatory Visit: Admitting: Physician Assistant

## 2024-07-08 VITALS — BP 130/70 | Temp 97.9°F | Ht 70.0 in | Wt 200.0 lb

## 2024-07-08 DIAGNOSIS — Z09 Encounter for follow-up examination after completed treatment for conditions other than malignant neoplasm: Secondary | ICD-10-CM

## 2024-07-08 DIAGNOSIS — M4316 Spondylolisthesis, lumbar region: Secondary | ICD-10-CM

## 2024-07-08 MED ORDER — OXYCODONE HCL 5 MG PO TABS: 5.0000 mg | ORAL_TABLET | Freq: Four times a day (QID) | ORAL | 0 refills | Status: AC | PRN

## 2024-07-08 NOTE — Progress Notes (Signed)
" ° °  REFERRING PHYSICIAN:  Rudolpho Norleen BIRCH, Md [address] [place],  [place] [address]  DOS: 06/28/2024, L4-5 ALIF  HISTORY OF PRESENT ILLNESS: Joe Tyler is approximately 2 weeks status post L4-5 ALIF. he is doing well.  His pain is managed with Robaxin ,Tylenol  and Oxycodone .  He feels as though his strength has improved. He actually  PHYSICAL EXAMINATION:  General: Patient is well developed, well nourished, calm, collected, and in no apparent distress.   NEUROLOGICAL:  General: In no acute distress.   Awake, alert, oriented to person, place, and time.  Pupils equal round and reactive to light.  Facial tone is symmetric.     Strength:            Side Iliopsoas Quads Hamstring PF DF EHL  R 5 5 5 5 5 5   L 5 5 5 5 5 5    Incisions c/d/I.  Significant ecchymosis    ROS (Neurologic):  Negative except as noted above  IMAGING: No interval imaging  ASSESSMENT/PLAN:  Joe Tyler is doing well approximately 2 weeks after L4-5 ALIF. he will follow up in approximately 1 month for 6-week follow-up visit.  He is currently doing home health physical therapy which is going well.I have advised the patient to lift up to 10 pounds until 6 weeks after surgery, then increase up to 25 pounds until 12 weeks after surgery.  After 12 weeks post-op, the patient advised to increase activity as tolerated.  Advised to contact the office if any questions or concerns arise.  Lyle Decamp PA-C Department of neurosurgery    "

## 2024-07-12 ENCOUNTER — Encounter: Admitting: Physician Assistant

## 2024-08-03 ENCOUNTER — Ambulatory Visit: Admitting: Dermatology

## 2024-08-10 ENCOUNTER — Encounter: Admitting: Neurosurgery

## 2024-08-10 ENCOUNTER — Other Ambulatory Visit

## 2024-09-21 ENCOUNTER — Other Ambulatory Visit

## 2024-09-21 ENCOUNTER — Encounter
